# Patient Record
Sex: Female | Born: 1950 | Race: White | Hispanic: No | Marital: Married | Smoking: Former smoker
Health system: Southern US, Community
[De-identification: ages and names within clinical notes are randomized; demographics above are authoritative.]

## PROBLEM LIST (undated history)

## (undated) DIAGNOSIS — I10 Essential (primary) hypertension: Secondary | ICD-10-CM

## (undated) DIAGNOSIS — I4891 Unspecified atrial fibrillation: Secondary | ICD-10-CM

## (undated) DIAGNOSIS — L4050 Arthropathic psoriasis, unspecified: Secondary | ICD-10-CM

## (undated) DIAGNOSIS — E119 Type 2 diabetes mellitus without complications: Secondary | ICD-10-CM

## (undated) DIAGNOSIS — E0500 Thyrotoxicosis with diffuse goiter without thyrotoxic crisis or storm: Secondary | ICD-10-CM

## (undated) DIAGNOSIS — G8929 Other chronic pain: Secondary | ICD-10-CM

## (undated) DIAGNOSIS — E559 Vitamin D deficiency, unspecified: Secondary | ICD-10-CM

## (undated) HISTORY — DX: Type 2 diabetes mellitus without complications: E11.9

## (undated) HISTORY — DX: Vitamin D deficiency, unspecified: E55.9

## (undated) HISTORY — DX: Unspecified atrial fibrillation: I48.91

## (undated) HISTORY — DX: Other chronic pain: G89.29

## (undated) HISTORY — PX: VARICOSE VEIN SURGERY: SHX832

## (undated) HISTORY — DX: Arthropathic psoriasis, unspecified: L40.50

## (undated) HISTORY — DX: Essential (primary) hypertension: I10

## (undated) HISTORY — PX: CHOLECYSTECTOMY: SHX55

## (undated) HISTORY — PX: ABDOMINAL HYSTERECTOMY: SHX81

## (undated) HISTORY — PX: TOTAL KNEE ARTHROPLASTY: SHX125

## (undated) HISTORY — DX: Thyrotoxicosis with diffuse goiter without thyrotoxic crisis or storm: E05.00

---

## 2014-08-03 ENCOUNTER — Ambulatory Visit (INDEPENDENT_AMBULATORY_CARE_PROVIDER_SITE_OTHER): Payer: 59 | Admitting: Cardiology

## 2014-08-03 ENCOUNTER — Encounter: Payer: Self-pay | Admitting: Cardiology

## 2014-08-03 VITALS — BP 119/75 | HR 66 | Ht 67.0 in | Wt 222.0 lb

## 2014-08-03 DIAGNOSIS — R9439 Abnormal result of other cardiovascular function study: Secondary | ICD-10-CM

## 2014-08-03 DIAGNOSIS — R0602 Shortness of breath: Secondary | ICD-10-CM

## 2014-08-03 DIAGNOSIS — R0789 Other chest pain: Secondary | ICD-10-CM

## 2014-08-03 MED ORDER — ASPIRIN EC 81 MG PO TBEC: 81.0000 mg | DELAYED_RELEASE_TABLET | Freq: Every day | ORAL | Status: DC

## 2014-08-03 NOTE — Progress Notes (Signed)
Clinical Summary [name] is a 63 y.o.female seen today as a new patient  1. Abnormal stress test/Chest pain - ordered by pcp in setting of shortness of breath - [place]iscan [place] LVEF 74% with normal wall motion, "low grade very small perfusion defect in mid and distal anterior wall with partial reversibility", thought possible artifact.   - SOB for several years. Often worst in humid weather. Highest level of activity [facility] [employer] [facility] [employer] [employer] [facility], denies any SOB. Chronic LE edema, has had prior vein surgeries and joint surgeries. No orthopnea - + chest pain starting several years ago. Pressure in midchest, 8/10. Can happen at rest. No other associated symptoms. Pressure lasts approx 5 minutes. Better with alka seltzer. No relation to eating. No change in frequency, approx every 6 months. No change in severity. Not positional.    Past Medical History HTN DM COPD Psoriatic arthritis  Not on File   Current Outpatient Prescriptions  Medication Sig Dispense Refill  . acetaminophen (TYLENOL) 500 MG tablet Take 500 mg by mouth 2 (two) times daily as needed.      Marland Kitchen. albuterol (PROVENTIL) (2.5 MG/3ML) 0.083% nebulizer solution Take 2.5 mg by nebulization every 6 (six) hours as needed for wheezing or shortness of breath.      . bisacodyl (BISACODYL) 5 MG EC tablet Take 5 mg by mouth 2 (two) times daily as needed for moderate constipation.      . celecoxib (CELEBREX) 200 MG capsule Take 200 mg by mouth 2 (two) times daily with a meal.      . clidinium-chlordiazePOXIDE (LIBRAX) 5-2.5 MG per capsule Take 1 capsule by mouth 3 (three) times daily before meals.      . clonazePAM (KLONOPIN) 2 MG tablet Take 2 mg by mouth 2 (two) times daily.      . cyanocobalamin (,VITAMIN B-12,) 1000 MCG/ML injection Inject 1,000 mcg into the muscle once. 3 injections first week, 1 injection weekly for 4 weeks, then monthly      . Diclofenac Sodium (PENNSAID) 1.5 % SOLN Place 1.5  each onto the skin as needed.      Marland Kitchen. esomeprazole (NEXIUM) 40 MG capsule Take 40 mg by mouth daily at 12 noon.      . Fluticasone-Salmeterol (ADVAIR) 250-50 MCG/DOSE AEPB Inhale 1 puff into the lungs as needed.      . folic acid (FOLVITE) 1 MG tablet Take 1 mg by mouth 2 (two) times daily.      Marland Kitchen. glucose blood test strip 1 each by Other route as needed for other. Use as instructed      . Lancets 28G MISC 28 g by Does not apply route daily.      . metformin (FORTAMET) 500 MG (OSM) 24 hr tablet Take 500 mg by mouth 2 (two) times daily. Take 2 tablets twice a day      . Methotrexate, PF, 30 MG/0.6ML SOAJ Inject into the skin.      . metoprolol succinate (TOPROL-XL) 50 MG 24 hr tablet Take 50 mg by mouth 2 (two) times daily. Take with or immediately following a meal.      . oxyCODONE (ROXICODONE) 15 MG immediate release tablet Take 15 mg by mouth as needed for pain. Take 1 1/2 tablet four times a day      . Pediatric Multivit-Minerals-C (FLINTSTONES GUMMIES) chewable tablet Chew 1 tablet by mouth 2 (two) times daily.      . potassium chloride (KLOR-CON)  20 MEQ packet Take 20 mEq by mouth 2 (two) times daily.      . promethazine (PHENERGAN) 25 MG tablet Take 25 mg by mouth as needed for nausea or vomiting. Take 1 tablet four times a day as needed      . triamcinolone (NASACORT AQ) 55 MCG/ACT AERO nasal inhaler Place 1 spray into the nose daily.      . Tuberculin-Allergy Syringes (B-D TB SYRINGE 1CC/22GX1") 22G X 1" 1 ML MISC by Does not apply route. Use as directed      . Ustekinumab (STELARA) 90 MG/ML SOSY Inject into the skin as needed. As needed for 3 months      . Vitamin D, Ergocalciferol, (DRISDOL) 50000 UNITS CAPS capsule Take 50,000 Units by mouth. One capsule three times a week      . zolpidem (AMBIEN) 10 MG tablet Take 10 mg by mouth at bedtime as needed for sleep.      Marland Kitchen aspirin EC 81 MG tablet Take 1 tablet (81 mg total) by mouth daily.       No current facility-administered medications  for this visit.     No past surgical history on file.   Not on File    No family history on file.   Social History Veronica Harrison reports that she has quit smoking. Her smoking use included Cigarettes. She started smoking about 10 years ago. She smoked 0.00 packs per day. She has never used smokeless tobacco. Veronica Harrison has no alcohol history on file.   Review of Systems CONSTITUTIONAL: No weight loss, fever, chills, weakness or fatigue.  HEENT: Eyes: No visual loss, blurred vision, double vision or yellow sclerae.No hearing loss, sneezing, congestion, runny nose or sore throat.  SKIN: No rash or itching.  CARDIOVASCULAR: per HPI RESPIRATORY: per HPI  GASTROINTESTINAL: No anorexia, nausea, vomiting or diarrhea. No abdominal pain or blood.  GENITOURINARY: No burning on urination, no polyuria NEUROLOGICAL: No headache, dizziness, syncope, paralysis, ataxia, numbness or tingling in the extremities. No change in bowel or bladder control.  MUSCULOSKELETAL: No muscle, back pain, joint pain or stiffness.  LYMPHATICS: No enlarged nodes. No history of splenectomy.  PSYCHIATRIC: No history of depression or anxiety.  ENDOCRINOLOGIC: No reports of sweating, cold or heat intolerance. No polyuria or polydipsia.  Marland Kitchen   Physical Examination Filed Vitals:   08/03/14 1412  BP: 119/75  Pulse: 66   Filed Weights   08/03/14 1412  Weight: 222 lb (100.699 kg)    Gen: resting comfortably, no acute distress HEENT: no scleral icterus, pupils equal round and reactive, no palptable cervical adenopathy,  CV: RRR, no m/rg, no JVD Resp: Clear to auscultation bilaterally GI: abdomen is soft, non-tender, non-distended, normal bowel sounds, no hepatosplenomegaly MSK: extremities are warm, trace bilateral edema Skin: warm, no rash Neuro:  no focal deficits Psych: appropriate affect    Assessment and Plan   1. Chest pain - low risk MPI, very small defect report indicates likely artifact -  symptoms are not typical of cardiac chest pain -  Continue CAD risk factor modification - given her SOB and LE edema, will obtain echo   F/u 6 months  Antoine Poche, M.D., F.A.C.C.

## 2014-08-03 NOTE — Patient Instructions (Addendum)
   Begin Aspirin 81mg  daily  Continue all other medications.   Your physician has requested that you have an echocardiogram. Echocardiography is a painless test that uses sound waves to create images of your heart. It provides your doctor with information about the size and shape of your heart and how well your heart's chambers and valves are working. This procedure takes approximately one hour. There are no restrictions for this procedure. Office will contact with results via phone or letter.   Your physician wants you to follow up in: 6 months.  You will receive a reminder letter in the mail one-two months in advance.  If you don't receive a letter, please call our office to schedule the follow up appointment

## 2014-08-14 ENCOUNTER — Other Ambulatory Visit (INDEPENDENT_AMBULATORY_CARE_PROVIDER_SITE_OTHER): Payer: 59

## 2014-08-14 ENCOUNTER — Other Ambulatory Visit: Payer: Self-pay

## 2014-08-14 DIAGNOSIS — R0602 Shortness of breath: Secondary | ICD-10-CM

## 2014-08-16 ENCOUNTER — Telehealth: Payer: Self-pay | Admitting: *Deleted

## 2014-08-16 NOTE — Telephone Encounter (Signed)
Message copied by Jerrye BeaversJONES, Sheneka Schrom on Wed Aug 16, 2014  3:03 PM ------      Message from: IrwintonBRANCH, JONATHAN F      Created: Wed Aug 16, 2014  2:07 PM       Overall her echo looks good. Her heart muscle is stiffer than normal, this often happens as we age and also if there is a history of high blood pressure. This can cause some fluid retention/swelling and SOB. The treatment is blood pressure control and using a fluid pill as needed. Would give Rx for lasix 20mg  PO qday PRN swelling or SOB.                  Dominga FerryJ Branch MD ------

## 2014-08-17 ENCOUNTER — Encounter: Payer: Self-pay | Admitting: Cardiology

## 2014-08-17 ENCOUNTER — Other Ambulatory Visit: Payer: Self-pay | Admitting: *Deleted

## 2014-08-17 MED ORDER — FUROSEMIDE 20 MG PO TABS: ORAL_TABLET | ORAL | Status: DC

## 2014-08-17 NOTE — Telephone Encounter (Signed)
Echo results given to patient. Pt understands Lasix sent to pharmacy.

## 2014-08-22 ENCOUNTER — Telehealth: Payer: Self-pay | Admitting: *Deleted

## 2014-08-22 NOTE — Telephone Encounter (Signed)
Pt called stating that she did not receive her Lasix that was escribed on 8/20 to [facility] pharmacy.  I called pharmacy and spoke with Caryn Bee. Rx was not received and called in on 8/25.  Pt aware.

## 2014-10-11 DIAGNOSIS — R32 Unspecified urinary incontinence: Secondary | ICD-10-CM | POA: Insufficient documentation

## 2015-02-28 ENCOUNTER — Encounter: Payer: Self-pay | Admitting: Cardiology

## 2015-02-28 ENCOUNTER — Ambulatory Visit (INDEPENDENT_AMBULATORY_CARE_PROVIDER_SITE_OTHER): Payer: Medicare Other | Admitting: Cardiology

## 2015-02-28 VITALS — BP 149/80 | HR 78 | Ht 67.0 in | Wt 221.0 lb

## 2015-02-28 DIAGNOSIS — R002 Palpitations: Secondary | ICD-10-CM

## 2015-02-28 DIAGNOSIS — R9439 Abnormal result of other cardiovascular function study: Secondary | ICD-10-CM

## 2015-02-28 DIAGNOSIS — R0789 Other chest pain: Secondary | ICD-10-CM

## 2015-02-28 NOTE — Patient Instructions (Signed)

## 2015-02-28 NOTE — Progress Notes (Signed)
Clinical Summary Veronica Harrison is a 64 y.o.female seen today for follow up of the following medical problems.   1. Abnormal stress test/Chest pain - ordered by pcp in setting of shortness of breath - Lexiscan MPI 06/12/14 in [place], [place] showed LVEF 74% with normal wall motion, "low grade very small perfusion defect in mid and distal anterior wall with partial reversibility", thought possible artifact.  - echo 07/2014 LVEF 60-65%, grade II diastolic dysfunction - since last visit has had some occasional chest pain that is better with alka seltzer. Chronic SOB typically improved with inhaler.    2. Palpitations - 3 episodes over the last 1 month. Longest lasted approx 6 hours, checked pulse and was up to 132. No specific triggers. Other episodes lasted just a few seconds. Last episode was last week - coffee x 2 cups, diet coke can x1, iced tea glass nightly, no energy drinks, no EtoH.    3. HTN - checks at home, typically 140s/60s - compliant with meds         Past Medical History HTN DM COPD Psoriatic arthritis No past medical history on file.   Not on File   Current Outpatient Prescriptions  Medication Sig Dispense Refill  . acetaminophen (TYLENOL) 500 MG tablet Take 500 mg by mouth 2 (two) times daily as needed.    Marland Kitchen albuterol (PROVENTIL) (2.5 MG/3ML) 0.083% nebulizer solution Take 2.5 mg by nebulization every 6 (six) hours as needed for wheezing or shortness of breath.    Marland Kitchen aspirin EC 81 MG tablet Take 1 tablet (81 mg total) by mouth daily.    . bisacodyl (BISACODYL) 5 MG EC tablet Take 5 mg by mouth 2 (two) times daily as needed for moderate constipation.    . celecoxib (CELEBREX) 200 MG capsule Take 200 mg by mouth 2 (two) times daily with a meal.    . clidinium-chlordiazePOXIDE (LIBRAX) 5-2.5 MG per capsule Take 1 capsule by mouth 3 (three) times daily before meals.    . clonazePAM (KLONOPIN) 2 MG tablet Take 2 mg by mouth 2 (two) times daily.    .  cyanocobalamin (,VITAMIN B-12,) 1000 MCG/ML injection Inject 1,000 mcg into the muscle once. 3 injections first week, 1 injection weekly for 4 weeks, then monthly    . Diclofenac Sodium (PENNSAID) 1.5 % SOLN Place 1.5 each onto the skin as needed.    Marland Kitchen esomeprazole (NEXIUM) 40 MG capsule Take 40 mg by mouth daily at 12 noon.    . Fluticasone-Salmeterol (ADVAIR) 250-50 MCG/DOSE AEPB Inhale 1 puff into the lungs as needed.    . folic acid (FOLVITE) 1 MG tablet Take 1 mg by mouth 2 (two) times daily.    . furosemide (LASIX) 20 MG tablet Take one tablet once a day as needed for swelling or shortness of breath. 30 tablet 6  . glucose blood test strip 1 each by Other route as needed for other. Use as instructed    . Lancets 28G MISC 28 g by Does not apply route daily.    . metformin (FORTAMET) 500 MG (OSM) 24 hr tablet Take 500 mg by mouth 2 (two) times daily. Take 2 tablets twice a day    . Methotrexate, PF, 30 MG/0.6ML SOAJ Inject into the skin.    . metoprolol succinate (TOPROL-XL) 50 MG 24 hr tablet Take 50 mg by mouth 2 (two) times daily. Take with or immediately following a meal.    . oxyCODONE (ROXICODONE) 15 MG immediate release tablet  Take 15 mg by mouth as needed for pain. Take 1 1/2 tablet four times a day    . Pediatric Multivit-Minerals-C (FLINTSTONES GUMMIES) chewable tablet Chew 1 tablet by mouth 2 (two) times daily.    . potassium chloride (KLOR-CON) 20 MEQ packet Take 20 mEq by mouth 2 (two) times daily.    . promethazine (PHENERGAN) 25 MG tablet Take 25 mg by mouth as needed for nausea or vomiting. Take 1 tablet four times a day as needed    . triamcinolone (NASACORT AQ) 55 MCG/ACT AERO nasal inhaler Place 1 spray into the nose daily.    . Tuberculin-Allergy Syringes (B-D TB SYRINGE 1CC/22GX1") 22G X 1" 1 ML MISC by Does not apply route. Use as directed    . Ustekinumab (STELARA) 90 MG/ML SOSY Inject into the skin as needed. As needed for 3 months    . Vitamin D, Ergocalciferol,  (DRISDOL) 50000 UNITS CAPS capsule Take 50,000 Units by mouth. One capsule three times a week    . zolpidem (AMBIEN) 10 MG tablet Take 10 mg by mouth at bedtime as needed for sleep.     No current facility-administered medications for this visit.     No past surgical history on file.   Not on File    No family history on file.   Social History Veronica Harrison reports that she has quit smoking. Her smoking use included Cigarettes. She started smoking about 10 years ago. She has never used smokeless tobacco. Veronica Harrison has no alcohol history on file.   Review of Systems CONSTITUTIONAL: No weight loss, fever, chills, weakness or fatigue.  HEENT: Eyes: No visual loss, blurred vision, double vision or yellow sclerae.No hearing loss, sneezing, congestion, runny nose or sore throat.  SKIN: No rash or itching.  CARDIOVASCULAR: per HPI RESPIRATORY: no cough or sputum.  GASTROINTESTINAL: No anorexia, nausea, vomiting or diarrhea. No abdominal pain or blood.  GENITOURINARY: No burning on urination, no polyuria NEUROLOGICAL: No headache, dizziness, syncope, paralysis, ataxia, numbness or tingling in the extremities. No change in bowel or bladder control.  MUSCULOSKELETAL: No muscle, back pain, joint pain or stiffness.  LYMPHATICS: No enlarged nodes. No history of splenectomy.  PSYCHIATRIC: No history of depression or anxiety.  ENDOCRINOLOGIC: No reports of sweating, cold or heat intolerance. No polyuria or polydipsia.  Marland Kitchen   Physical Examination p 78 bp 149/80 Wt 221 lbs BMI 35 Gen: resting comfortably, no acute distress HEENT: no scleral icterus, pupils equal round and reactive, no palptable cervical adenopathy,  CV: RRR, no m/r/g, no JVD, no carotid bruits Resp: Clear to auscultation bilaterally GI: abdomen is soft, non-tender, non-distended, normal bowel sounds, no hepatosplenomegaly MSK: extremities are warm, no edema.  Skin: warm, no rash Neuro:  no focal deficits Psych:  appropriate affect   Diagnostic Studies  07/2014 Echo Study Conclusions  - Left ventricle: The cavity size was normal. Systolic function was normal. The estimated ejection fraction was in the range of 60% to 65%. Wall motion was normal; there were no regional wall motion abnormalities. Features are consistent with a pseudonormal left ventricular filling pattern, with concomitant abnormal relaxation and increased filling pressure (grade 2 diastolic dysfunction). Moderate posterior wall and mild septal hypertrophy. - Mitral valve: There was trivial regurgitation. - Left atrium: The atrium was mildly dilated.   Assessment and Plan  1. Chest pain - low risk MPI, very small defect, report indicates likely artifact - symptoms are not typical of cardiac chest pain - Continue CAD risk factor modification  2. Palpitations - counseled to decrease caffeine intake and follow symptoms. If persist with obtain event monitor  3. HTN - at goal, continue current meds  F/u 6 months      Antoine PocheJonathan F. Madason Rauls, M.D.

## 2015-03-06 ENCOUNTER — Telehealth: Payer: Self-pay | Admitting: Cardiology

## 2015-03-06 NOTE — Telephone Encounter (Signed)
[facility] 03[facility]06[facility]15 offered sooner appointment with Dr Wyline [place]Branch [place] 3[place]9 @ 1:40 pm but patient refused after her request to be seen this week. Patient was scheduled with our first available on March 24th, but instructed to call if needed before.  She also asked for [facility] [address] office number to request to see an EP doctor.  I gave her the number but explained Dr Wyline MoodBranch typically would refer her if she needed to see an EP doctor.

## 2015-03-08 ENCOUNTER — Ambulatory Visit (INDEPENDENT_AMBULATORY_CARE_PROVIDER_SITE_OTHER): Payer: Medicare Other | Admitting: Cardiology

## 2015-03-08 ENCOUNTER — Encounter: Payer: Self-pay | Admitting: *Deleted

## 2015-03-08 ENCOUNTER — Encounter: Payer: Self-pay | Admitting: Cardiology

## 2015-03-08 VITALS — BP 153/74 | HR 84 | Ht 67.0 in | Wt 223.0 lb

## 2015-03-08 DIAGNOSIS — I480 Paroxysmal atrial fibrillation: Secondary | ICD-10-CM

## 2015-03-08 DIAGNOSIS — E119 Type 2 diabetes mellitus without complications: Secondary | ICD-10-CM

## 2015-03-08 DIAGNOSIS — I10 Essential (primary) hypertension: Secondary | ICD-10-CM

## 2015-03-08 MED ORDER — METOPROLOL SUCCINATE ER 25 MG PO TB24: 75.0000 mg | ORAL_TABLET | Freq: Every day | ORAL | Status: DC

## 2015-03-08 NOTE — Patient Instructions (Signed)
Your physician recommends that you schedule a follow-up appointment in: 3 months with DR. Branch  Your physician has recommended you make the following change in your medication:   INCREASE TOPROL TO 75 MG DAILY  START ASPIRIN 325 MG DAILY  CONTINUE ALL OTHER MEDICATIONS AS DIRECTED  Thank you for choosing [facility]!!

## 2015-03-08 NOTE — Progress Notes (Addendum)
Clinical Summary Veronica Harrison is a 64 y.o.female seen today for follow up on the following medical problems. This is a focused visit on a recent diagnosis of afib.    1. Afib  - [facility] with [facility] reaction to amoxicillin. Found to be in afib with RVR rates in 140-150s, new diagnosis for patient. Started on dilt gtt and converted to NSR. TSH at time was low at 0.13, high free T4 at 2.09. Mg 1.5 - she has had intermittent palpitations on and off for the last several months - CHADS2Vasc score is 3( gender, HTN, DM), decision to start anticoag was deferred during that admission until our follow up.     PMH 1. HTN 2. DM2 3. Hyperthyroidism   No Known Allergies   Current Outpatient Prescriptions  Medication Sig Dispense Refill  . acetaminophen (TYLENOL) 500 MG tablet Take 500 mg by mouth 2 (two) times daily as needed.    Marland Kitchen. albuterol (PROVENTIL) (2.5 MG/3ML) 0.083% nebulizer solution Take 2.5 mg by nebulization every 6 (six) hours as needed for wheezing or shortness of breath.    Marland Kitchen. aspirin EC 81 MG tablet Take 1 tablet (81 mg total) by mouth daily.    . bisacodyl (BISACODYL) 5 MG EC tablet Take 5 mg by mouth 2 (two) times daily as needed for moderate constipation.    . celecoxib (CELEBREX) 200 MG capsule Take 200 mg by mouth 2 (two) times daily with a meal.    . clidinium-chlordiazePOXIDE (LIBRAX) 5-2.5 MG per capsule Take 1 capsule by mouth 3 (three) times daily before meals.    . clonazePAM (KLONOPIN) 2 MG tablet Take 2 mg by mouth 2 (two) times daily.    . cyanocobalamin (,VITAMIN B-12,) 1000 MCG/ML injection Inject 1,000 mcg into the muscle once. 3 injections first week, 1 injection weekly for 4 weeks, then monthly    . Diclofenac Sodium (PENNSAID) 1.5 % SOLN Place 1.5 each onto the skin as needed.    Marland Kitchen. esomeprazole (NEXIUM) 40 MG capsule Take 40 mg by mouth daily at 12 noon.    .  Fluticasone-Salmeterol (ADVAIR) 250-50 MCG/DOSE AEPB Inhale 1 puff into the lungs as needed.    . folic acid (FOLVITE) 1 MG tablet Take 1 mg by mouth 2 (two) times daily.    . furosemide (LASIX) 20 MG tablet Take one tablet once a day as needed for swelling or shortness of breath. 30 tablet 6  . glucose blood test strip 1 each by Other route as needed for other. Use as instructed    . Lancets 28G MISC 28 g by Does not apply route daily.    . metFORMIN (GLUCOPHAGE) 1000 MG tablet Take 1,000 mg by mouth 2 (two) times daily with a meal.    . Methotrexate, PF, 30 MG/0.6ML SOAJ Inject 17 mg into the skin once a week.    . metoprolol succinate (TOPROL-XL) 50 MG 24 hr tablet Take 50 mg by mouth 2 (two) times daily. Take with or immediately following a meal.    . oxyCODONE (ROXICODONE) 15 MG immediate release tablet Take 15 mg by mouth as needed for pain. Take 1 1/2 tablet four times a day    . Pediatric Multivit-Minerals-C (FLINTSTONES GUMMIES) chewable tablet Chew 1 tablet by mouth 2 (two) times daily.    . potassium chloride (KLOR-CON) 20 MEQ packet Take 20 mEq by mouth 2 (two) times daily.    .Marland Kitchen  promethazine (PHENERGAN) 25 MG tablet Take 25 mg by mouth as needed for nausea or vomiting. Take 1 tablet four times a day as needed    . triamcinolone (NASACORT AQ) 55 MCG/ACT AERO nasal inhaler Place 1 spray into the nose daily.    . Tuberculin-Allergy Syringes (B-D TB SYRINGE 1CC/22GX1") 22G X 1" 1 ML MISC by Does not apply route. Use as directed    . Ustekinumab (STELARA) 90 MG/ML SOSY Inject into the skin as needed. As needed for 3 months    . Vitamin D, Ergocalciferol, (DRISDOL) 50000 UNITS CAPS capsule Take 50,000 Units by mouth. One capsule three times a week     No current facility-administered medications for this visit.     No past surgical history on file.   No Known Allergies    No family history on file.   Social History Ms. Bray reports that she quit smoking about 10 years ago.  Her smoking use included Cigarettes. She started smoking about 45 years ago. She has a 34 pack-year smoking history. She has never used smokeless tobacco. Ms. George has no alcohol history on file.   Review of Systems CONSTITUTIONAL: No weight loss, fever, chills, weakness or fatigue.  HEENT: Eyes: No visual loss, blurred vision, double vision or yellow sclerae.No hearing loss, sneezing, congestion, runny nose or sore throat.  SKIN: No rash or itching.  CARDIOVASCULAR: per HPI RESPIRATORY: No shortness of breath, cough or sputum.  GASTROINTESTINAL: No anorexia, nausea, vomiting or diarrhea. No abdominal pain or blood.  GENITOURINARY: No burning on urination, no polyuria NEUROLOGICAL: No headache, dizziness, syncope, paralysis, ataxia, numbness or tingling in the extremities. No change in bowel or bladder control.  MUSCULOSKELETAL: No muscle, back pain, joint pain or stiffness.  LYMPHATICS: No enlarged nodes. No history of splenectomy.  PSYCHIATRIC: No history of depression or anxiety.  ENDOCRINOLOGIC: No reports of sweating, cold or heat intolerance. No polyuria or polydipsia.  Marland Kitchen   Physical Examination p 84 bp 153/74 Wt 223 lbs BMI 35 Gen: resting comfortably, no acute distress HEENT: no scleral icterus, pupils equal round and reactive, no palptable cervical adenopathy,  CV: RRR, no m/r/g, no JVD Resp: Clear to auscultation bilaterally GI: abdomen is soft, non-tender, non-distended, normal bowel sounds, no hepatosplenomegaly MSK: extremities are warm, no edema.  Skin: warm, no rash Neuro:  no focal deficits Psych: appropriate affect   Diagnostic Studies 07/2014 Echo Study Conclusions  - Left ventricle: The cavity size was normal. Systolic function was normal. The estimated ejection fraction was in the range of 60% to 65%. Wall motion was normal; there were no regional wall motion abnormalities. Features are consistent with a pseudonormal left ventricular filling  pattern, with concomitant abnormal relaxation and increased filling pressure (grade 2 diastolic dysfunction). Moderate posterior wall and mild septal hypertrophy. - Mitral valve: There was trivial regurgitation. - Left atrium: The atrium was mildly dilated.     Assessment and Plan  1. Afib - new diagnosis during recent admission - occurred in setting of allergic reaction. Mg was low, and also she has been newly diagnosed with hyperthyroidism - will increase her Toprol XL to  bid - discussed in detail stroke risk given CHADS2Vasc score of 3, and that optimal prevention would be with anticoag. She is hesitant to start at this time and requests to remain on ASA despite the increased risk. Continue ASA, readdress next visit  2. Hyperthyroidism - I have asked her to discuss with her pcp possible referral to endocrine  F/u  3 months      Antoine Poche, M.D.   Addendum 03/15/15 Received notice that patient is going to have colonscopy. May hold ASA as needed for procedure. From cardiac standpoint there is no contraindication.  Dominga Ferry MD

## 2015-03-09 ENCOUNTER — Telehealth: Payer: Self-pay | Admitting: *Deleted

## 2015-03-09 MED ORDER — METOPROLOL SUCCINATE ER 25 MG PO TB24: 75.0000 mg | ORAL_TABLET | Freq: Two times a day (BID) | ORAL | Status: DC

## 2015-03-09 NOTE — Telephone Encounter (Signed)
Patient informed. 

## 2015-03-09 NOTE — Telephone Encounter (Signed)
Patient called to clarify dose of toprol xl. Patient was instructed to increase her toprol xl to 100 mg twice daily but instructions and prescription sent to pharmacy says's increase toprol xl to 75 mg daily. Please advise on correct directions and dose for toprol xl.

## 2015-03-09 NOTE — Telephone Encounter (Signed)
Veronica Harrison was on Toprol XL 50mg  bid, she should increase to Toprol XL 75mg  bid.

## 2015-03-10 DIAGNOSIS — I10 Essential (primary) hypertension: Secondary | ICD-10-CM | POA: Insufficient documentation

## 2015-03-10 DIAGNOSIS — E119 Type 2 diabetes mellitus without complications: Secondary | ICD-10-CM | POA: Insufficient documentation

## 2015-03-10 DIAGNOSIS — I4891 Unspecified atrial fibrillation: Secondary | ICD-10-CM | POA: Insufficient documentation

## 2015-03-13 ENCOUNTER — Encounter: Payer: Self-pay | Admitting: Cardiology

## 2015-03-14 ENCOUNTER — Telehealth: Payer: Self-pay | Admitting: *Deleted

## 2015-03-14 NOTE — Telephone Encounter (Signed)
Pt having colonoscopy and EGD on 3/29 and Dr. Merlinda Frederick'neil's office wanting a letter of clearance from cardiologist. Pt is having root canal  Possible extraction on April 4th and wanting to know if epinephrine will cause arrhthymias. Pt also wanting Dr. Wyline MoodBranch to know that she will want to start anticoagulation after these procedures. Will forward to Dr. Wyline MoodBranch

## 2015-03-14 NOTE — Telephone Encounter (Signed)
Heart rate of 88 is within normal range, this would not cause fatigue. The recent increase in her Toprol would more likely be the cause of fatigue as well as her ongoing thyroid issues. If related to Toprol typically this gets better as your body gets used to the higher dose. Regarding the thyroid that is the responsibility of her primary care doctor. I had offered to place a referral if she was unable to get in touch with her pcp, but now that referral is in  in there is nothing we can do to expedite her appointment.   Dominga FerryJ Zamorah Ailes MD

## 2015-03-14 NOTE — Telephone Encounter (Signed)
Pt made aware. Was told by us and primary Dr. Claiborne Billingshat normal HR is 60-100. Explained that since we didn't make referral that we couldn't expedite appt. Pt understood and thanked us for returning call.

## 2015-03-14 NOTE — Telephone Encounter (Signed)
Pt c/o HR 88 and being tired and easily SOB. Pt has been taking metoprolol as directed at LOV. Pt states she has not felt any "skipped beats" but HR is consistently at 88 and pt says this HR is making her tired. Pt has been referred to Dr. Juleen ChinaKohut for Thyroid. Pt would like us to call that office to speed up getting an appt. Pt was referred by Dr. Burna MortimerIsernia. Pt says has called that office numerous times. Will call Kohut's office and forward to Dr. Wyline MoodBranch for further suggestions.

## 2015-03-15 NOTE — Telephone Encounter (Signed)
Pt made aware. Forwarded note to Dr. Carmon Ginsberg'neil. Pt will call after procedures are complete to start anticoagulation.

## 2015-03-15 NOTE — Telephone Encounter (Signed)
Please send my addendum note to her GI doctor. She is ok for both her GI and dental procedure, if needed ok to hold aspirin. There is no contraindication to using epinephrine for bleeding control during dental procedure. Please have her call us back one all procedures are completed and we will get her started on anticoagulation.  Dominga FerryJ Arabela Basaldua MD

## 2015-03-22 ENCOUNTER — Encounter: Payer: Medicare Other | Admitting: Cardiology

## 2015-03-27 ENCOUNTER — Telehealth: Payer: Self-pay | Admitting: Cardiology

## 2015-03-27 NOTE — Telephone Encounter (Signed)
Dr. O'Neill office needs more information from our office for upcoming colonoscopy that has been postponed due to lack of informBufford Buttneration. Please call Dr. Bufford Buttner'Neill [phone] office and ask for [facility] . Mrs. Lajean SilviusBateman would like a call back.

## 2015-04-03 ENCOUNTER — Telehealth: Payer: Self-pay | Admitting: *Deleted

## 2015-04-03 NOTE — Telephone Encounter (Signed)
Claudia from Dr. Colin Ina'Neill's office is off today and will return tomorrow. Called pt and explained that we had sent Dr. Verna CzechBranch's note on the 17th of March giving clearance from a cardiac standpoint for colonoscopy with ok to hold ASA as directed. Pt states that she should have enough info to schedule. Will call us if any other documentation is needed.

## 2015-04-03 NOTE — Telephone Encounter (Signed)
Pt says its time for stelera injection 90 mg once every 3 months if this is still ok, pt is concerned it might effect heart. Will forward to Dr. Wyline MoodBranch

## 2015-04-04 NOTE — Telephone Encounter (Signed)
Stelera injections are ok from a heart standpoint  Dominga FerryJ Kameron Glazebrook MD

## 2015-04-04 NOTE — Telephone Encounter (Signed)
Pt made aware

## 2015-06-04 ENCOUNTER — Other Ambulatory Visit: Payer: Self-pay | Admitting: Cardiology

## 2015-06-19 ENCOUNTER — Encounter: Payer: Self-pay | Admitting: *Deleted

## 2015-06-20 ENCOUNTER — Encounter: Payer: Self-pay | Admitting: Cardiology

## 2015-06-20 ENCOUNTER — Ambulatory Visit (INDEPENDENT_AMBULATORY_CARE_PROVIDER_SITE_OTHER): Payer: Medicare Other | Admitting: Cardiology

## 2015-06-20 VITALS — BP 139/75 | HR 72 | Ht 67.0 in | Wt 222.0 lb

## 2015-06-20 DIAGNOSIS — I4891 Unspecified atrial fibrillation: Secondary | ICD-10-CM

## 2015-06-20 MED ORDER — FUROSEMIDE 20 MG PO TABS: ORAL_TABLET | ORAL | Status: DC

## 2015-06-20 NOTE — Progress Notes (Signed)
Clinical Summary Veronica Harrison is a 64 y.o.female seen today for follow up of the following medical problems.   1. Afib  - fairly new diagnosis during recent admit to [facility] - admit to [facility] with allergic reaction to amoxicillin. Found to be in afib with RVR rates in 140-150s, new diagnosis for patient. Started on dilt gtt and converted to NSR. TSH at time was low at 0.13, high free T4 at 2.09. Mg 1.5 - she has had intermittent palpitations on and off for the last several months - CHADS2Vasc score is 3( gender, HTN, DM), she did not want to start anticoagulation at last visit.  - since last visit reports fairly infrequent episodes of palpitations.    2. Hyperthyroidism - she reports diagnosed with Graves disease, followed by endocrine   Past Medical History  Diagnosis Date  . Type 2 diabetes mellitus   . Hypertension   . Psoriatic arthritis   . Chronic pain   . Atrial fibrillation   . Vitamin D deficiency      No Known Allergies   Current Outpatient Prescriptions  Medication Sig Dispense Refill  . acetaminophen (TYLENOL) 500 MG tablet Take 500 mg by mouth 2 (two) times daily as needed.    Marland Kitchen albuterol (PROVENTIL) (2.5 MG/3ML) 0.083% nebulizer solution Take 2.5 mg by nebulization every 6 (six) hours as needed for wheezing or shortness of breath.    Marland Kitchen aspirin 325 MG tablet Take 325 mg by mouth daily.    . bisacodyl (BISACODYL) 5 MG EC tablet Take 5 mg by mouth 2 (two) times daily as needed for moderate constipation.    . celecoxib (CELEBREX) 200 MG capsule Take 200 mg by mouth 2 (two) times daily with a meal.    . clidinium-chlordiazePOXIDE (LIBRAX) 5-2.5 MG per capsule Take 1 capsule by mouth 3 (three) times daily before meals.    . clindamycin (CLEOCIN) 150 MG capsule Take by mouth 4 (four) times daily.    . clonazePAM (KLONOPIN) 2 MG tablet Take 1 mg by mouth at bedtime.     . cyanocobalamin (,VITAMIN B-12,) 1000 MCG/ML injection Inject 1,000 mcg  into the muscle once. 3 injections first week, 1 injection weekly for 4 weeks, then monthly    . Diclofenac Sodium (PENNSAID) 1.5 % SOLN Place 1.5 each onto the skin as needed.    Marland Kitchen esomeprazole (NEXIUM) 40 MG capsule Take 40 mg by mouth daily at 12 noon.    . Fluticasone-Salmeterol (ADVAIR) 250-50 MCG/DOSE AEPB Inhale 1 puff into the lungs as needed.    . folic acid (FOLVITE) 1 MG tablet Take 1 mg by mouth 2 (two) times daily.    . furosemide (LASIX) 20 MG tablet Take one tablet once a day as needed for swelling or shortness of breath. 30 tablet 6  . glucose blood test strip 1 each by Other route as needed for other. Use as instructed    . Iron-Vitamin C (VITRON-C PO) Take 1 tablet by mouth daily.    . Lancets 28G MISC 28 g by Does not apply route daily.    . magnesium oxide (MAG-OX) 400 MG tablet Take 400 mg by mouth 2 (two) times daily.    . metFORMIN (GLUCOPHAGE) 1000 MG tablet Take 1,000 mg by mouth 2 (two) times daily with a meal.    . Methotrexate, PF, 30 MG/0.6ML SOAJ Inject 17 mg into the skin once a week.    . metoprolol succinate (TOPROL-XL) 25 MG 24 hr tablet  TAKE 3 TABLETS ( ) BY MOUTH TWICE DAILY 180 tablet 2  . oxyCODONE (ROXICODONE) 15 MG immediate release tablet Take 15 mg by mouth as needed for pain. Take 1 1/2 tablet four times a day    . potassium chloride (KLOR-CON) 20 MEQ packet Take 20 mEq by mouth 2 (two) times daily.    . promethazine (PHENERGAN) 25 MG tablet Take 25 mg by mouth as needed for nausea or vomiting. Take 1 tablet four times a day as needed    . triamcinolone (NASACORT AQ) 55 MCG/ACT AERO nasal inhaler Place 1 spray into the nose daily.    . Tuberculin-Allergy Syringes (B-D TB SYRINGE 1CC/22GX1") 22G X 1" 1 ML MISC by Does not apply route. Use as directed    . Ustekinumab (STELARA) 90 MG/ML SOSY Inject into the skin as needed. As needed for 3 months    . Vitamin D, Ergocalciferol, (DRISDOL) 50000 UNITS CAPS capsule Take 50,000 Units by mouth. One capsule  three times a week     No current facility-administered medications for this visit.     Past Surgical History  Procedure Laterality Date  . Varicose vein surgery Left   . Cholecystectomy    . Total knee arthroplasty Right   . Abdominal hysterectomy       No Known Allergies    Family History  Problem Relation Age of Onset  . Diabetes    . CAD       Social History Veronica Harrison reports that she quit smoking about 10 years ago. Her smoking use included Cigarettes. She started smoking about 45 years ago. She has a 34 pack-year smoking history. She has never used smokeless tobacco. Veronica Harrison has no alcohol history on file.   Review of Systems CONSTITUTIONAL: No weight loss, fever, chills, weakness or fatigue.  HEENT: Eyes: No visual loss, blurred vision, double vision or yellow sclerae.No hearing loss, sneezing, congestion, runny nose or sore throat.  SKIN: No rash or itching.  CARDIOVASCULAR: per HPI RESPIRATORY: No shortness of breath, cough or sputum.  GASTROINTESTINAL: No anorexia, nausea, vomiting or diarrhea. No abdominal pain or blood.  GENITOURINARY: No burning on urination, no polyuria NEUROLOGICAL: No headache, dizziness, syncope, paralysis, ataxia, numbness or tingling in the extremities. No change in bowel or bladder control.  MUSCULOSKELETAL: No muscle, back pain, joint pain or stiffness.  LYMPHATICS: No enlarged nodes. No history of splenectomy.  PSYCHIATRIC: No history of depression or anxiety.  ENDOCRINOLOGIC: No reports of sweating, cold or heat intolerance. No polyuria or polydipsia.  Marland Kitchen   Physical Examination Filed Vitals:   06/20/15 1457  BP: 139/75  Pulse: 72   Filed Vitals:   06/20/15 1457  Height:  (1.702 m)  Weight: 222 lb (100.699 kg)    Gen: resting comfortably, no acute distress HEENT: no scleral icterus, pupils equal round and reactive, no palptable cervical adenopathy,  CV: RRR, no m/rg, no jVD, no carotid bruits Resp: Clear to  auscultation bilaterally GI: abdomen is soft, non-tender, non-distended, normal bowel sounds, no hepatosplenomegaly MSK: extremities are warm, no edema.  Skin: warm, no rash Neuro:  no focal deficits Psych: appropriate affect   Diagnostic Studies  07/2014 Echo Study Conclusions  - Left ventricle: The cavity size was normal. Systolic function was normal. The estimated ejection fraction was in the range of 60% to 65%. Wall motion was normal; there were no regional wall motion abnormalities. Features are consistent with a pseudonormal left ventricular filling pattern, with concomitant abnormal relaxation and increased  filling pressure (grade 2 diastolic dysfunction). Moderate posterior wall and mild septal hypertrophy. - Mitral valve: There was trivial regurgitation. - Left atrium: The atrium was mildly dilated.   Assessment and Plan   1. Afib - new diagnosis during recent admission - occurred in setting of allergic reaction. Mg was low, and also she has been newly diagnosed with hyperthyroidism. Unclear if isolated episode in this setting or if ongoing problem - will obtain 14 day event monitor to evaluate for afib to help decide on use of long term anticoagulation. Continue ASA for now.   2. Hyperthyroidism - followed by endocrine, she reports numbers are trending down, has not required therapy as of yet.    F/u 6 months. Once monitor results available, may need earlier appointment to discuss anticoagulation.    Antoine Poche, MD

## 2015-06-20 NOTE — Patient Instructions (Signed)
Your physician wants you to follow-up in: 6 months with Dr. Lurena Joiner will receive a reminder letter in the mail two months in advance. If you don't receive a letter, please call our office to schedule the follow-up appointment.  Your physician has recommended you make the following change in your medication:   DECREASE ASPIRIN 325 MG DAILY  WE HAVE REFILLED LASIX  Your physician has recommended that you wear an event monitor FOR 2 WEEKS. Event monitors are medical devices that record the heart's electrical activity. Doctors most often [place] these monitors to diagnose arrhythmias. Arrhythmias are problems with the speed or rhythm of the heartbeat. The monitor is a small, portable device. You can wear one while you do your normal daily activities. This is usually used to diagnose what is causing palpitations/syncope (passing out).  Thank you for choosing [facility]!!

## 2015-06-22 ENCOUNTER — Telehealth: Payer: Self-pay | Admitting: *Deleted

## 2015-06-22 NOTE — Telephone Encounter (Signed)
Pt will send event monitor back and would like to try new bodyguard wireless monitor. Will call [employer] to make this arrangement. Pt agreeable to this plan. Will forward as FYI to Dr. Wyline Mood

## 2015-06-22 NOTE — Telephone Encounter (Signed)
Pt received event monitor and says it is to heavy to wear with the weather being hot and pt only wear elastic. Would like to know if she could wear 48 hr holter instead. Will forward to Dr. Wyline Mood

## 2015-06-22 NOTE — Telephone Encounter (Signed)
48 hour monitor would not be a long enough sample time. Still recommend the event monitor  Dominga Ferry MD

## 2015-06-27 ENCOUNTER — Telehealth: Payer: Self-pay | Admitting: *Deleted

## 2015-06-27 NOTE — Telephone Encounter (Signed)
Pt insurance will not cover 2 week even monitor and pt will not be able to afford. Will forward to Dr. Wyline MoodBranch for further instructions

## 2015-06-27 NOTE — Telephone Encounter (Signed)
Insurance will only cover lifewatch and cardionet.

## 2015-06-27 NOTE — Telephone Encounter (Signed)
Patient had episode of atrial fibrillation, we are looking for recurrence to consider anticoagulation for her to lower her stroke risk. Do we know why her insurance refused   Dominga FerryJ Deforest Maiden MD

## 2015-06-29 NOTE — Telephone Encounter (Signed)
Pt says she is [facility] to [facility] will [facility] event monitor, doesn't  not want [place] without checking closer location 1st. [facility] and have monitor placed if she could not get [place]. Pt will call and let us know Tuesday how she will proceed. Will Forward to Dr. Wyline MoodBranch as Lorain ChildesFYI

## 2015-07-30 ENCOUNTER — Encounter: Payer: Self-pay | Admitting: *Deleted

## 2015-08-20 ENCOUNTER — Telehealth: Payer: Self-pay | Admitting: *Deleted

## 2015-08-20 NOTE — Telephone Encounter (Signed)
Opened in error

## 2015-08-20 NOTE — Telephone Encounter (Signed)
Pt will come to appt tomorrow 8/23

## 2015-08-20 NOTE — Telephone Encounter (Signed)
-----   Message from Antoine Poche, MD sent at 08/16/2015 12:22 PM EDT ----- Regarding: RE: [name] Let her know that monitor received, it does show some episodes of afib. Has she given any more thought to anticoagulation that we talked about last visit? The fact that the afib is still happening continues to put her at higher risk for stroke.   Dominga Ferry MD ----- Message -----    From: Albertine Patricia, CMA    Sent: 08/16/2015   8:39 AM      To: Antoine Poche, MD Subject: FYI                                            [name] was scanned into media, didn't know if it was routed to you.   Veronica Harrison

## 2015-08-21 ENCOUNTER — Encounter: Payer: Self-pay | Admitting: Cardiology

## 2015-08-21 ENCOUNTER — Encounter: Payer: Self-pay | Admitting: *Deleted

## 2015-08-21 ENCOUNTER — Ambulatory Visit (INDEPENDENT_AMBULATORY_CARE_PROVIDER_SITE_OTHER): Payer: Medicare Other | Admitting: Cardiology

## 2015-08-21 VITALS — BP 149/77 | HR 88 | Ht 67.0 in | Wt 217.0 lb

## 2015-08-21 DIAGNOSIS — I480 Paroxysmal atrial fibrillation: Secondary | ICD-10-CM

## 2015-08-21 MED ORDER — METOPROLOL SUCCINATE ER 25 MG PO TB24: 100.0000 mg | ORAL_TABLET | Freq: Two times a day (BID) | ORAL | Status: DC

## 2015-08-21 MED ORDER — DILTIAZEM HCL 30 MG PO TABS: 30.0000 mg | ORAL_TABLET | Freq: Two times a day (BID) | ORAL | Status: DC

## 2015-08-21 NOTE — Patient Instructions (Signed)
Your physician recommends that you schedule a follow-up appointment in: 3 MONTHS WITH DR. BRANCH  Your physician has recommended you make the following change in your medication:   INCREASE TOPROL 4 TAB 100 MG TWICE DAILY  START DILTIAZEM 30 MG TWICE DAILY  Thank you for choosing [facility]!!

## 2015-08-21 NOTE — Progress Notes (Signed)
Patient ID: Veronica Harrison, female   DOB: 1951/06/25, 64 y.o.   MRN: [id number]     Clinical Summary Veronica Harrison is a 64 y.o.female seen today for follow up of the following medical problems.   1. Afib  - fairly new diagnosis during recent admit to [facility] - admit to [facility] with allergic reaction to amoxicillin. Found to be in afib with RVR rates in 140-150s, new diagnosis for patient. Started on dilt gtt and converted to NSR. TSH at time was low at 0.13, high free T4 at 2.09. Mg 1.5  - since last visit completed holter monitor which did show recurrent episodes of afib, some episodes with elevated rates.  - notes palpitations at times. 3-4 times a day, lasts up to 30 minutes.    2.Anemia -  Seen by Dr Flora Lipps GI in [place], has appointment tomorrow. She reports FOBT was + by home stool card.    3. Hyperthyroidism - she reports diagnosed with Graves disease, followed by endocrine Past Medical History  Diagnosis Date  . Type 2 diabetes mellitus   . Hypertension   . Psoriatic arthritis   . Chronic pain   . Atrial fibrillation   . Vitamin D deficiency   . Graves' disease      No Known Allergies   Current Outpatient Prescriptions  Medication Sig Dispense Refill  . acetaminophen (TYLENOL) 500 MG tablet Take 500 mg by mouth 2 (two) times daily as needed.    Marland Kitchen albuterol (PROVENTIL) (2.5 MG/3ML) 0.083% nebulizer solution Take 2.5 mg by nebulization every 6 (six) hours as needed for wheezing or shortness of breath.    Marland Kitchen aspirin 325 MG tablet Take 325 mg by mouth daily.     . bisacodyl (BISACODYL) 5 MG EC tablet Take 5 mg by mouth 2 (two) times daily as needed for moderate constipation.    . celecoxib (CELEBREX) 200 MG capsule Take 200 mg by mouth 2 (two) times daily with a meal.    . clidinium-chlordiazePOXIDE (LIBRAX) 5-2.5 MG per capsule Take 1 capsule by mouth daily.     . clindamycin (CLEOCIN) 150 MG capsule Take 600 mg by mouth as directed.     .  clonazePAM (KLONOPIN) 2 MG tablet Take 1 mg by mouth at bedtime.     . cyanocobalamin (,VITAMIN B-12,) 1000 MCG/ML injection Inject 1,000 mcg into the muscle every 30 (thirty) days.     Marland Kitchen esomeprazole (NEXIUM) 40 MG capsule Take 40 mg by mouth daily at 12 noon.    . Fluticasone-Salmeterol (ADVAIR) 250-50 MCG/DOSE AEPB Inhale 1 puff into the lungs as needed.    . folic acid (FOLVITE) 1 MG tablet Take 1 mg by mouth 2 (two) times daily.    . furosemide (LASIX) 20 MG tablet Take one tablet once a day as needed for swelling or shortness of breath. 30 tablet 6  . glucose blood test strip 1 each by Other route as needed for other. Use as instructed    . Iron-Vitamin C (VITRON-C PO) Take 1 tablet by mouth daily.    . Lancets 28G MISC 28 g by Does not apply route daily.    . magnesium oxide (MAG-OX) 400 MG tablet Take 400 mg by mouth 2 (two) times daily.    . metFORMIN (GLUCOPHAGE) 1000 MG tablet Take 1,000 mg by mouth 2 (two) times daily with a meal.    . Methotrexate, PF, 30 MG/0.6ML SOAJ Inject 17 mg into the skin once a week.    Marland Kitchen  metoprolol succinate (TOPROL-XL) 25 MG 24 hr tablet TAKE 3 TABLETS (75MG ) BY MOUTH TWICE DAILY 180 tablet 2  . oxyCODONE (ROXICODONE) 15 MG immediate release tablet Take 15 mg by mouth as needed for pain. Take 1 1/2 tablet four times a day    . potassium chloride (KLOR-CON) 20 MEQ packet Take 20 mEq by mouth 2 (two) times daily.    . promethazine (PHENERGAN) 25 MG tablet Take 25 mg by mouth as needed for nausea or vomiting. Take 1 tablet four times a day as needed    . triamcinolone (NASACORT AQ) 55 MCG/ACT AERO nasal inhaler Place 1 spray into the nose daily.    . Tuberculin-Allergy Syringes (B-D TB SYRINGE 1CC/22GX1") 22G X 1" 1 ML MISC by Does not apply route. Use as directed    . Ustekinumab (STELARA) 90 MG/ML SOSY Inject into the skin as needed. As needed for 3 months    . Vitamin D, Ergocalciferol, (DRISDOL) 50000 UNITS CAPS capsule Take 50,000 Units by mouth. One  capsule three times a week     No current facility-administered medications for this visit.     Past Surgical History  Procedure Laterality Date  . Varicose vein surgery Left   . Cholecystectomy    . Total knee arthroplasty Right   . Abdominal hysterectomy       No Known Allergies    Family History  Problem Relation Age of Onset  . Diabetes    . CAD       Social History Veronica Harrison reports that she quit smoking about 10 years ago. Her smoking use included Cigarettes. She started smoking about 45 years ago. She has a 34 pack-year smoking history. She has never used smokeless tobacco. Veronica Harrison has no alcohol history on file.   Review of Systems CONSTITUTIONAL: No weight loss, fever, chills, weakness or fatigue.  HEENT: Eyes: No visual loss, blurred vision, double vision or yellow sclerae.No hearing loss, sneezing, congestion, runny nose or sore throat.  SKIN: No rash or itching.  CARDIOVASCULAR: per HPI RESPIRATORY: No shortness of breath, cough or sputum.  GASTROINTESTINAL: No anorexia, nausea, vomiting or diarrhea. No abdominal pain or blood.  GENITOURINARY: No burning on urination, no polyuria NEUROLOGICAL: No headache, dizziness, syncope, paralysis, ataxia, numbness or tingling in the extremities. No change in bowel or bladder control.  MUSCULOSKELETAL: No muscle, back pain, joint pain or stiffness.  LYMPHATICS: No enlarged nodes. No history of splenectomy.  PSYCHIATRIC: No history of depression or anxiety.  ENDOCRINOLOGIC: No reports of sweating, cold or heat intolerance. No polyuria or polydipsia.  Marland Kitchen   Physical Examination Filed Vitals:   08/21/15 1136  BP: 149/77  Pulse: 88   Filed Vitals:   08/21/15 1136  Height: 5\' 7"  (1.702 m)  Weight: 217 lb (98.431 kg)    Gen: resting comfortably, no acute distress HEENT: no scleral icterus, pupils equal round and reactive, no palptable cervical adenopathy,  CV: RRR, no m/r/g, no jvd Resp: Clear to  auscultation bilaterally GI: abdomen is soft, non-tender, non-distended, normal bowel sounds, no hepatosplenomegaly MSK: extremities are warm, no edema.  Skin: warm, no rash Neuro:  no focal deficits Psych: appropriate affect   Diagnostic Studies 07/2014 Echo Study Conclusions  - Left ventricle: The cavity size was normal. Systolic function was normal. The estimated ejection fraction was in the range of 60% to 65%. Wall motion was normal; there were no regional wall motion abnormalities. Features are consistent with a pseudonormal left ventricular filling pattern, with concomitant  abnormal relaxation and increased filling pressure (grade 2 diastolic dysfunction). Moderate posterior wall and mild septal hypertrophy. - Mitral valve: There was trivial regurgitation. - Left atrium: The atrium was mildly dilated.   06/2015 Holter monitor [facility]: short episodes of afib with RVR  Assessment and Plan  1. Afib - initially thought to be isolated episode during hospital admission, however cardiac monitor shows recurrent episodes - continues to have palpitations. Will increase Toprol XL to  bid, start diltiazem  prn - CHADS2Vasc score is 3 (gender, HTN, DM2), she is willing to start anticoag but will need to wait until her anemia workup is complete. She is to call after she follows up with GI.    2. Hyperthyroidism - followed by endocrine, she reports diagnosis of Graves diease   F/u 3 months     Antoine Poche, M.D.

## 2015-08-22 ENCOUNTER — Telehealth: Payer: Self-pay | Admitting: Cardiology

## 2015-08-22 NOTE — Telephone Encounter (Signed)
Diltiazem should be  po bid prn palpitations. It is not to be taken scheduled, only as needed but would not take more than twice in a day   JB

## 2015-08-22 NOTE — Telephone Encounter (Signed)
diltiazem (CARDIZEM) 30 MG tablet Needs clarification on how to take this medication

## 2015-08-22 NOTE — Telephone Encounter (Signed)
Patient stated that Dr. Wyline Mood told her during office visit that she was to only take the Diltiazem  as needed, NOT "TWICE A DAY" as per her check out sheet.  Informed patient that message will be sent to Dr. Wyline Mood for clarification.  Patient verbalized understanding & will hold on taking any till hear back from [place].

## 2015-08-23 MED ORDER — DILTIAZEM HCL 30 MG PO TABS: 30.0000 mg | ORAL_TABLET | ORAL | Status: DC | PRN

## 2015-08-23 NOTE — Telephone Encounter (Signed)
Patient notified.  Prescription changed at pharmacy & placed on file for future refills.

## 2015-09-10 ENCOUNTER — Telehealth: Payer: Self-pay | Admitting: *Deleted

## 2015-09-10 DIAGNOSIS — I4891 Unspecified atrial fibrillation: Secondary | ICD-10-CM

## 2015-09-10 NOTE — Telephone Encounter (Signed)
Pt says last Saturday had another "episode" of Afib that lasted all night and into Sunday night before relief, pt did take diltiazem 2 times that day but says it didn't help. Pt says she is ready to start anticoagulation now and would like some guidelines as to when to report to ED when having an episode. Will forward to Dr. Wyline Mood

## 2015-09-11 NOTE — Telephone Encounter (Signed)
Can we get the records from her GI doctors to follow up on the results of her testing. I'd like to see there notes regarding her anemia before starting her blood thinner. For her afib please varify she is taking her Toprol XL  bid. If so, they I would have her starting taking her ditliazem  bid every instead of just as needed. If she has palpitations ok to take additional diltiazem. Hopefully we can get things controlled with this combination of meds, if not we will need to consider another class of meds. Going to the ER is based on symptoms, if she has severe palpitations or chest pains then I would recommend going.    Dominga Ferry MD

## 2015-09-11 NOTE — Telephone Encounter (Signed)
Correction on diltiazem, pt will take diltiazem 30 mg BID instead of PRN

## 2015-09-11 NOTE — Telephone Encounter (Signed)
Pt had labs done 08/24/15 they are scanned into Epic. Pt is compliant on Toprol XL 100 mg BID and agreed to take diltiazem 30 mg BID daily instead of BID. Pt is clear on going to ED when having severe palpitations and/or chest pain.

## 2015-09-13 ENCOUNTER — Encounter: Payer: Self-pay | Admitting: *Deleted

## 2015-09-13 NOTE — Telephone Encounter (Signed)
Records requested from Dr. Merlinda Frederick office.

## 2015-09-13 NOTE — Telephone Encounter (Signed)
She had a workup for anemia including a colonscopy and perhaps an EGD as well. Are we able to get those records, I'd like those reports before starting blood thinner  Dominga Ferry MD

## 2015-09-18 NOTE — Telephone Encounter (Signed)
Records received and faxed to the [facility] office so that they can be reviewed by Dr. Wyline Mood.

## 2015-09-19 ENCOUNTER — Telehealth: Payer: Self-pay

## 2015-09-19 MED ORDER — APIXABAN 5 MG PO TABS: 5.0000 mg | ORAL_TABLET | Freq: Two times a day (BID) | ORAL | Status: DC

## 2015-09-19 NOTE — Telephone Encounter (Signed)
Please let patient know that records were received from her GI doctor and reviewed. We will go ahead and start her on anticoag with eliquis  bid. She can get samples if she likes from the office as well. Please start eliquis  bid. She does not need to take aspirin. Needs repeat CBC in 3 weeks.  Dominga Ferry MD

## 2015-09-19 NOTE — Telephone Encounter (Signed)
1 month supply Eliquis 5 mg supply provided for pt -Branch pt- ,see log out book

## 2015-09-19 NOTE — Telephone Encounter (Signed)
Pt will pick up samples of Eiliquis at [facility] office, mailed order for CBC in 3 weeks, also wrote down instructions for Eiliquis and Diltiazem as requested by pt. Pt verbalized understanding and will call with further problems/concerns

## 2015-09-21 ENCOUNTER — Telehealth: Payer: Self-pay | Admitting: *Deleted

## 2015-09-21 NOTE — Telephone Encounter (Signed)
Pt says after insurance, out of pocket expense would be $105/month for Eiliquis. Pt cannot afford this. Mailed pt assistance [employer] and pt agreeable to apply.

## 2015-09-21 NOTE — Telephone Encounter (Signed)
PA for Eliquis approved through 09/20/16 from [employer]. # [id number]. Faxed approval to pharmacy

## 2015-10-02 ENCOUNTER — Telehealth: Payer: Self-pay | Admitting: *Deleted

## 2015-10-02 MED ORDER — DILTIAZEM HCL 30 MG PO TABS: 30.0000 mg | ORAL_TABLET | Freq: Two times a day (BID) | ORAL | Status: DC

## 2015-10-02 NOTE — Telephone Encounter (Signed)
Pt says she is swelling under and around both eyes and has 4lbs weight gain since yesterday. Pt denies CP/dizziness/SOB and thinks it may be related to starting Eiliquis. Pt also requesting refill of diltiazem. Medication sent to pharmacy. Will forward to Dr. Wyline Mood

## 2015-10-03 NOTE — Telephone Encounter (Signed)
Swelling is not a common side effect of eliquis, I would doubt that its related. Has she taken any of her prn lasix to help. I would recommend continuing her eliquis and trying her prn lasix for the time being.  Dominga Ferry MD

## 2015-10-03 NOTE — Telephone Encounter (Signed)
Ok to double up on lasix today and tomorrow. If persists over the next few days can add her on for early next week   Dominga Ferry MD

## 2015-10-03 NOTE — Telephone Encounter (Signed)
Pt has been taking lasix daily for last 3 days

## 2015-10-03 NOTE — Telephone Encounter (Signed)
LM with instructions. Will f/u on Friday if pt doesn't return call

## 2015-10-04 NOTE — Telephone Encounter (Signed)
Pt returned call today, and says that swelling is better, so pt will not double lasix. Pt will take prn lasix as directed for swelling or weight gain, labs will be done in 2 weeks and pt will let us know so that we may obtain copy. Pt says she would like to come off some of her meds if labs are normal. Will f/u in 2 weeks

## 2015-10-05 ENCOUNTER — Other Ambulatory Visit: Payer: Self-pay | Admitting: *Deleted

## 2015-10-05 ENCOUNTER — Telehealth: Payer: Self-pay | Admitting: Cardiology

## 2015-10-05 MED ORDER — APIXABAN 5 MG PO TABS: 5.0000 mg | ORAL_TABLET | Freq: Two times a day (BID) | ORAL | Status: DC

## 2015-10-05 NOTE — Telephone Encounter (Signed)
Spoke with pharmacist at [facility] they didn't receive refill for diltiazem on 10/4. Gave verbal order for diltiazem 30 mg BID #60 with 3 refills. Pt made aware.

## 2015-10-05 NOTE — Telephone Encounter (Signed)
diltiazem (CARDIZEM) 30 MG tablet  Patient states that [facility] told her they have not received RX

## 2015-10-09 ENCOUNTER — Telehealth: Payer: Self-pay | Admitting: *Deleted

## 2015-10-09 NOTE — Telephone Encounter (Signed)
Pt approved through [employer] patient assistance program for Eliquis through 12/29/2015. Case # [id number].

## 2015-10-16 ENCOUNTER — Telehealth: Payer: Self-pay | Admitting: *Deleted

## 2015-10-16 ENCOUNTER — Ambulatory Visit (INDEPENDENT_AMBULATORY_CARE_PROVIDER_SITE_OTHER): Payer: Medicare Other | Admitting: *Deleted

## 2015-10-16 VITALS — BP 180/70 | Wt 211.0 lb

## 2015-10-16 DIAGNOSIS — I4891 Unspecified atrial fibrillation: Secondary | ICD-10-CM

## 2015-10-16 MED ORDER — DILTIAZEM HCL 60 MG PO TABS: 60.0000 mg | ORAL_TABLET | Freq: Two times a day (BID) | ORAL | Status: DC

## 2015-10-16 NOTE — Telephone Encounter (Signed)
[facility] for [facility] and nurse visit. Will be able to provide advice regarding medication titration afterwards. Any shortness of breath or dizziness?

## 2015-10-16 NOTE — Telephone Encounter (Signed)
-----   Message from Laqueta LindenSuresh A Koneswaran, MD sent at 10/16/2015  1:02 PM EDT ----- Increase diltiazem to 60 mg bid as HR not controlled.

## 2015-10-16 NOTE — Telephone Encounter (Signed)
Pt says feeling like she has been in "abnormal rhythm" since last night, says HR last night 106 173/132 BP. This AM BP 160/80 HR 100. Pt says she took extra dose of cardizem last night that helped her to go to sleep, and pt took another dose this morning. Pt taking BP while we were on phone and was 116/80 HR 137 and pt taking another dose of Cardizem now. Will forward to Dr. Purvis SheffieldKoneswaran with Dr Wyline MoodBranch out of office for further advise

## 2015-10-16 NOTE — Telephone Encounter (Signed)
Pt denies dizziness/SOB will come now for EKG nurse visit.

## 2015-10-16 NOTE — Progress Notes (Signed)
Pt denies CP/dizziness/SOB, says she is shaky, sweating, but relates this to anxiety and not feeling well since last night. Pt has gained 5lbs in 7 days, will take lasix today, has taken 60 mg Cardizem today and 4 tabs of metoprolol as directed will take another 4 tabs tonight. Ekg performed HR remains at 110. Will forward to Dr. Purvis SheffieldKoneswaran for further instructions. Pt declines to stay at office [place] [facility] [place] [facility] [place]

## 2015-10-16 NOTE — Telephone Encounter (Signed)
Pt aware and has already taken 60 mg this morning and will take 60 mg this evening. Pt will call back if HR still remains elevated. Will f/u as well. Medication sent to pharmacy and updated list

## 2015-10-22 ENCOUNTER — Encounter: Payer: Self-pay | Admitting: *Deleted

## 2015-10-24 ENCOUNTER — Telehealth: Payer: Self-pay | Admitting: *Deleted

## 2015-10-24 DIAGNOSIS — I4891 Unspecified atrial fibrillation: Secondary | ICD-10-CM

## 2015-10-24 NOTE — Telephone Encounter (Signed)
Pt is feeling worse and will have husband drive her to [facility]. Pt would like to know if any other medication change can be made, since this is happening every week.

## 2015-10-24 NOTE — Telephone Encounter (Signed)
Can she come see me tomorrow at 940?

## 2015-10-24 NOTE — Telephone Encounter (Signed)
BP 170/127 HR 130 pt feels she is in A-fib again, does not feel well, woke up this morning feeling this way, will forward to Dr. Wyline MoodBranch

## 2015-10-24 NOTE — Telephone Encounter (Signed)
Has she taken her meds this AM including her prn diltiazem. If symptoms are continuing she should go to the ER  Dominga FerryJ Erinn Huskins MD

## 2015-10-24 NOTE — Telephone Encounter (Signed)
Pt has taken all meds this AM, says she cannot afford to go to ED again. Dose of diltiazem was increased to 60 mg BID

## 2015-10-25 ENCOUNTER — Other Ambulatory Visit: Payer: Self-pay | Admitting: *Deleted

## 2015-10-25 MED ORDER — DILTIAZEM HCL ER 60 MG PO CP12: 60.0000 mg | ORAL_CAPSULE | Freq: Three times a day (TID) | ORAL | Status: DC

## 2015-10-25 MED ORDER — DILTIAZEM HCL 60 MG PO TABS: 60.0000 mg | ORAL_TABLET | Freq: Three times a day (TID) | ORAL | Status: DC

## 2015-10-25 NOTE — Telephone Encounter (Signed)
Pt did not go to [facility] yesterday, says she took an extra diltiazem, klonopin, and slept. At 4 pm yesterday she felt better, but felt "hungover" this AM. BP this morning is 150/80. Spoke with Dr. Wyline MoodBranch, will refer to A-fib clinic and await med change until pt can be seen in A-fib clinic.

## 2015-10-25 NOTE — Telephone Encounter (Signed)
Patient should go up to dilt 60mg  three times a day until we can get her into afib clinic. Likely at that appointment they will consider a different class of medicine for her, something called an antiarrhythmic which will work to try to keep her out of afib.  Dominga FerryJ Jona Zappone MD

## 2015-10-25 NOTE — Telephone Encounter (Signed)
Spoke with Kennyth ArnoldStacy at A-fib clinic appt set for 10/26/15 @ 11:30AM. Pt aware and given phone number and directions to clinic. Appreciative of call.

## 2015-10-26 ENCOUNTER — Encounter (HOSPITAL_COMMUNITY): Payer: Self-pay | Admitting: Nurse Practitioner

## 2015-10-26 ENCOUNTER — Ambulatory Visit (HOSPITAL_COMMUNITY)
Admission: RE | Admit: 2015-10-26 | Discharge: 2015-10-26 | Disposition: A | Discharge status: Home / Self Care | Payer: Medicare Other | Source: Ambulatory Visit | Attending: Nurse Practitioner | Admitting: Nurse Practitioner

## 2015-10-26 VITALS — BP 160/70 | HR 81 | Ht 67.0 in | Wt 207.4 lb

## 2015-10-26 DIAGNOSIS — I481 Persistent atrial fibrillation: Secondary | ICD-10-CM | POA: Diagnosis present

## 2015-10-26 DIAGNOSIS — E0500 Thyrotoxicosis with diffuse goiter without thyrotoxic crisis or storm: Secondary | ICD-10-CM | POA: Insufficient documentation

## 2015-10-26 DIAGNOSIS — I480 Paroxysmal atrial fibrillation: Secondary | ICD-10-CM | POA: Diagnosis not present

## 2015-10-26 MED ORDER — SOTALOL HCL 80 MG PO TABS: 80.0000 mg | ORAL_TABLET | Freq: Two times a day (BID) | ORAL | Status: DC

## 2015-10-26 NOTE — Progress Notes (Addendum)
Patient ID: Veronica Harrison, female   DOB: 10/09/1951, 64 y.o.   MRN: [id number]     Primary Care Physician: Arma HeadingISERNIA,JAMES, MD Referring Physician: Dr. Jillene BucksBranch   Veronica Harrison is a 64 y.o. female with a h/o Graves disease, PAF, HTN, Psoriatic arthritis for evaluation in the afib clinic for high afib burden. [place] onset afib with rvr with v rates in ther 140-150's. She was also found to be hyperthyroid dx'ed with Graves Disease. She just swallowed the "capsule" for treatment of her thyroid 10/10 but per pt may take 3 months to normalize. She is currently in SR but was in afib early today, this episode only lasting 2-3 hours. Usually, she is in it all day and she has to go to bed because she feels so poorly. No h/o sleep apnea, no alcohol use, no high caffeine use. Unable to be very active due to joint issues.  Today, she denies symptoms of palpitations, chest pain, shortness of breath, orthopnea, PND, lower extremity edema, dizziness, presyncope, syncope, or neurologic sequela. The patient is tolerating medications without difficulties and is otherwise without complaint today.   Past Medical History  Diagnosis Date  . Type 2 diabetes mellitus (HCC)   . Hypertension   . Psoriatic arthritis (HCC)   . Chronic pain   . Atrial fibrillation (HCC)   . Vitamin D deficiency   . Graves' disease    Past Surgical History  Procedure Laterality Date  . Varicose vein surgery Left   . Cholecystectomy    . Total knee arthroplasty Right   . Abdominal hysterectomy      Current Outpatient Prescriptions  Medication Sig Dispense Refill  . acetaminophen (TYLENOL) 500 MG tablet Take 500 mg by mouth 2 (two) times daily as needed.    Marland Kitchen. albuterol (PROVENTIL) (2.5 MG/3ML) 0.083% nebulizer solution Take 2.5 mg by nebulization every 6 (six) hours as needed for wheezing or shortness of breath.    Marland Kitchen. apixaban (ELIQUIS) 5 MG TABS tablet Take 1 tablet (5 mg total) by  mouth 2 (two) times daily. 180 tablet 3  . bisacodyl (BISACODYL) 5 MG EC tablet Take 5 mg by mouth 2 (two) times daily as needed for moderate constipation.    . clidinium-chlordiazePOXIDE (LIBRAX) 5-2.5 MG per capsule Take 1 capsule by mouth daily.     . clindamycin (CLEOCIN) 150 MG capsule Take 600 mg by mouth as directed. Prior to dentist appointments    . clonazePAM (KLONOPIN) 2 MG tablet Take 1 mg by mouth at bedtime.     . cyanocobalamin (,VITAMIN B-12,) 1000 MCG/ML injection Inject 1,000 mcg into the muscle every 30 (thirty) days.     Marland Kitchen. diltiazem (CARDIZEM) 60 MG tablet Take 1 tablet (60 mg total) by mouth 3 (three) times daily. (Patient taking differently: Take 60 mg by mouth 3 (three) times daily. Take 2 tablets 3 times a day.) 90 tablet 3  . Fluticasone-Salmeterol (ADVAIR) 250-50 MCG/DOSE AEPB Inhale 1 puff into the lungs as needed.    . folic acid (FOLVITE) 1 MG tablet Take 1 mg by mouth 2 (two) times daily.    . furosemide (LASIX) 20 MG tablet Take one tablet once a day as needed for swelling or shortness of breath. 30 tablet 6  . glucose blood test strip 1 each by Other route as needed for other. Use as instructed    . Iron-Vitamin C (VITRON-C PO) Take 1 tablet by mouth daily.    . Lancets  28G MISC 28 g by Does not apply route daily.    . magnesium oxide (MAG-OX) 400 MG tablet Take 400 mg by mouth 2 (two) times daily.    . metFORMIN (GLUCOPHAGE) 1000 MG tablet Take 1,000 mg by mouth 2 (two) times daily with a meal.    . Methotrexate, PF, 30 MG/0.6ML SOAJ Inject 20 mg into the skin once a week.     . metoprolol succinate (TOPROL XL) 25 MG 24 hr tablet Take 4 tablets (100 mg total) by mouth 2 (two) times daily. 240 tablet 3  . oxyCODONE (ROXICODONE) 15 MG immediate release tablet Take 15 mg by mouth as needed for pain. Take 1 1/2 tablet four times a day    . potassium chloride (KLOR-CON) 20 MEQ packet Take 20 mEq by mouth 2 (two) times daily.    . promethazine (PHENERGAN) 25 MG tablet  Take 25 mg by mouth as needed for nausea or vomiting. Take 1 tablet four times a day as needed    . triamcinolone (NASACORT AQ) 55 MCG/ACT AERO nasal inhaler Place 1 spray into the nose daily.    . Tuberculin-Allergy Syringes (B-D TB SYRINGE 1CC/22GX1") 22G X 1" 1 ML MISC by Does not apply route. Use as directed    . Ustekinumab (STELARA) 90 MG/ML SOSY Inject into the skin as needed. As needed for 3 months    . sotalol (BETAPACE) 80 MG tablet Take 1 tablet (80 mg total) by mouth 2 (two) times daily. 60 tablet 3   No current facility-administered medications for this encounter.    Allergies  Allergen Reactions  . Penicillins Anaphylaxis  . Strawberry Extract Anaphylaxis  . Ace Inhibitors Other (See Comments)    Feeling Faint     Social History   Social History  . Marital Status: Married    Spouse Name: N/A  . Number of Children: N/A  . Years of Education: N/A   Occupational History  . Not on file.   Social History Main Topics  . Smoking status: Former Smoker -- 1.00 packs/day for 34 years    Types: Cigarettes    Start date: 01/29/1970    Quit date: 09/30/2004  . Smokeless tobacco: Never Used     Comment: 10 years ago  . Alcohol Use: Not on file  . Drug Use: Not on file  . Sexual Activity: Not on file   Other Topics Concern  . Not on file   Social History Narrative    Family History  Problem Relation Age of Onset  . Diabetes    . CAD      ROS- All systems are reviewed and negative except as per the HPI above  Physical Exam: Filed Vitals:   10/26/15 1116  BP: 160/70  Pulse: 81  Height:  (1.702 m)  Weight: 207 lb 6.4 oz (94.076 kg)    GEN- The patient is well appearing, alert and oriented x 3 today.   Head- normocephalic, atraumatic Eyes-  Sclera clear, conjunctiva pink Ears- hearing intact Oropharynx- clear Neck- supple, no JVP Lymph- no cervical lymphadenopathy Lungs- Clear to ausculation bilaterally, normal work of breathing Heart- Regular  rate and rhythm, no murmurs, rubs or gallops, PMI not laterally displaced GI- soft, NT, ND, + BS Extremities- no clubbing, cyanosis, or edema MS- no significant deformity or atrophy Skin- no rash or lesion Psych- euthymic mood, full affect Neuro- strength and sensation are intact  EKG- NSR, normal  EKG, v rate 81 bpm, pr int 140 ms,  qrs int 70 ms, qtc 415 ms.  Unable to see any records re thyroid treatment in [facility].   Assessment and Plan: 1. Persistent symptomatic afib  Discussed possible antiarrythmic options to help maintain SR, multaq would be appropriate to use, but checked with her pharmacist and will cost pt $200 a month which will be a hardship on her. She is not a candidate for flecainide/rythmol(1c agents) with moderate LVH. As long as she is maintaining sotalol, Dr. Johney Frame suggests sotalol 80 mg bid to be started as an outpt, but can only be started as outpt if she continues in SR. Amiodarone is not an option due to thyroid issues and tikosyn may again be too expensive , cost of drug, plus the necessary hospitalization.   2. Graves disease Under treatment for same Most likely is  contributing to afib burden  Is going to see new endocrinologist in [place], Nov 17th   She will continue with current doses of cardizem and BB, but cautioned if HR falls below 60 with sotalol may have to reduce rate control meds. She is to return on Monday for repeat EKG.  Pt was discussed wirh Dr. Johney Frame  Addendum-10/31-Labs received from PCP from 6/16, show normal creatinine at 0.61  BUN 9, Kt 3.9, GFR >60. Liver enzymes normal, TSH-0.11, Free T4 1.64, HGB 10.2, Hematocrit 32.8 (chronic)  [facility] [address] [place], [place] [address] [phone]

## 2015-10-26 NOTE — Patient Instructions (Signed)
Your physician has recommended you make the following change in your medication:  1)Sotalol 80mg  twice daily  If heart rate starts going below 60 decrease cardizem.

## 2015-10-29 ENCOUNTER — Inpatient Hospital Stay (HOSPITAL_COMMUNITY): Admission: RE | Admit: 2015-10-29 | Payer: Medicare Other | Source: Ambulatory Visit | Admitting: Nurse Practitioner

## 2015-10-29 NOTE — Addendum Note (Signed)
Encounter addended by: Newman Niponna C Mackensi Mahadeo, NP on: 10/29/2015  3:16 PM<BR>     Documentation filed: Notes Section

## 2015-10-30 ENCOUNTER — Ambulatory Visit (HOSPITAL_COMMUNITY)
Admission: RE | Admit: 2015-10-30 | Discharge: 2015-10-30 | Disposition: A | Discharge status: Home / Self Care | Payer: Medicare Other | Source: Ambulatory Visit | Attending: Nurse Practitioner | Admitting: Nurse Practitioner

## 2015-10-30 DIAGNOSIS — I480 Paroxysmal atrial fibrillation: Secondary | ICD-10-CM | POA: Diagnosis not present

## 2015-10-30 DIAGNOSIS — I4891 Unspecified atrial fibrillation: Secondary | ICD-10-CM | POA: Insufficient documentation

## 2015-10-30 LAB — BASIC METABOLIC PANEL
ANION GAP: 7 (ref 5–15)
BUN: <5 mg/dL — ABNORMAL LOW (ref 6–20)
CO2: 29 mmol/L (ref 22–32)
Calcium: 9.4 mg/dL (ref 8.9–10.3)
Chloride: 99 mmol/L — ABNORMAL LOW (ref 101–111)
Creatinine, Ser: 0.57 mg/dL (ref 0.44–1.00)
GFR calc Af Amer: >60 mL/min (ref >60)
GFR calc non Af Amer: >60 mL/min (ref >60)
GLUCOSE: 110 mg/dL — AB (ref 65–99)
POTASSIUM: 4.1 mmol/L (ref 3.5–5.1)
Sodium: 135 mmol/L (ref 135–145)

## 2015-10-30 NOTE — Progress Notes (Addendum)
Pt's visit is EKG and lab work only. Lupita LeashDonna will review the EKG with pt.   Pt started on sotalol  80 mg bid and is here  3 days later for f/u EKG. She has been staying in SR, pr int 146 ms, qrs int 80 ms, qtc 394 ms. She will be given a f/u appointment with Dr. Wyline MoodBranch in 2 weeks. She is still having BP problems. Hyperthyroidism is still under treatment. She will watch for bradycardia and report if her heart rate gets below 50 bpm. Will see in afib clinic as needed.

## 2015-10-31 ENCOUNTER — Encounter: Payer: Self-pay | Admitting: *Deleted

## 2015-11-08 ENCOUNTER — Encounter: Payer: Self-pay | Admitting: *Deleted

## 2015-11-12 ENCOUNTER — Encounter: Payer: Self-pay | Admitting: Cardiology

## 2015-11-12 ENCOUNTER — Ambulatory Visit (INDEPENDENT_AMBULATORY_CARE_PROVIDER_SITE_OTHER): Payer: Medicare Other | Admitting: Cardiology

## 2015-11-12 VITALS — BP 148/72 | HR 76 | Ht 67.0 in | Wt 202.0 lb

## 2015-11-12 DIAGNOSIS — I480 Paroxysmal atrial fibrillation: Secondary | ICD-10-CM | POA: Diagnosis not present

## 2015-11-12 NOTE — Progress Notes (Signed)
Patient ID: ARYAM ZHAN, female   DOB: 02/20/1951, 64 y.o.   MRN: [id number]     Clinical Summary Ms. Teo is a 64 y.o.female seen today for follow up of the following medical problems. This is a focused visit on her history of afib, for more detailed history please refer to prior clinic notes.   1. Afib  - we were unable to control her symptoms with up titration of AV nodal agents. She was referred to afib clinic for antiarrhythmic consideration. Started on solatol  bid in afib clinic 10/26/15. Baseline QTc was 415, f/u EKG 1 week after starting sotalol QTc around 400.  - she initially did well, however she reports an 18 hour episode of palpitations on Friday. - she is also currently being treated for Graves disease  Past Medical History  Diagnosis Date  . Type 2 diabetes mellitus (HCC)   . Hypertension   . Psoriatic arthritis (HCC)   . Chronic pain   . Atrial fibrillation (HCC)   . Vitamin D deficiency   . Graves' disease      Allergies  Allergen Reactions  . Penicillins Anaphylaxis  . Strawberry Extract Anaphylaxis  . Ace Inhibitors Other (See Comments)    Feeling Faint      Current Outpatient Prescriptions  Medication Sig Dispense Refill  . acetaminophen (TYLENOL) 500 MG tablet Take 500 mg by mouth 2 (two) times daily as needed.    Marland Kitchen albuterol (PROVENTIL) (2.5 MG/3ML) 0.083% nebulizer solution Take 2.5 mg by nebulization every 6 (six) hours as needed for wheezing or shortness of breath.    Marland Kitchen apixaban (ELIQUIS) 5 MG TABS tablet Take 1 tablet (5 mg total) by mouth 2 (two) times daily. 180 tablet 3  . bisacodyl (BISACODYL) 5 MG EC tablet Take 5 mg by mouth 2 (two) times daily as needed for moderate constipation.    . clidinium-chlordiazePOXIDE (LIBRAX) 5-2.5 MG per capsule Take 1 capsule by mouth daily.     . clindamycin (CLEOCIN) 150 MG capsule Take 600 mg by mouth as directed. Prior to dentist appointments    . clonazePAM (KLONOPIN) 2 MG tablet Take 1 mg by  mouth at bedtime.     . cyanocobalamin (,VITAMIN B-12,) 1000 MCG/ML injection Inject 1,000 mcg into the muscle every 30 (thirty) days.     Marland Kitchen diltiazem (CARDIZEM) 60 MG tablet Take 1 tablet (60 mg total) by mouth 3 (three) times daily. (Patient taking differently: Take 60 mg by mouth 3 (three) times daily. Take 2 tablets 3 times a day.) 90 tablet 3  . Fluticasone-Salmeterol (ADVAIR) 250-50 MCG/DOSE AEPB Inhale 1 puff into the lungs as needed.    . folic acid (FOLVITE) 1 MG tablet Take 1 mg by mouth 2 (two) times daily.    . furosemide (LASIX) 20 MG tablet Take one tablet once a day as needed for swelling or shortness of breath. 30 tablet 6  . glucose blood test strip 1 each by Other route as needed for other. Use as instructed    . Iron-Vitamin C (VITRON-C PO) Take 1 tablet by mouth daily.    . Lancets 28G MISC 28 g by Does not apply route daily.    . magnesium oxide (MAG-OX) 400 MG tablet Take 400 mg by mouth 2 (two) times daily.    . metFORMIN (GLUCOPHAGE) 1000 MG tablet Take 1,000 mg by mouth 2 (two) times daily with a meal.    . Methotrexate, PF, 30 MG/0.6ML SOAJ Inject 20 mg into the skin  once a week.     . metoprolol succinate (TOPROL XL) 25 MG 24 hr tablet Take 4 tablets (100 mg total) by mouth 2 (two) times daily. 240 tablet 3  . oxyCODONE (ROXICODONE) 15 MG immediate release tablet Take 15 mg by mouth as needed for pain. Take 1 1/2 tablet four times a day    . potassium chloride (KLOR-CON) 20 MEQ packet Take 20 mEq by mouth 2 (two) times daily.    . promethazine (PHENERGAN) 25 MG tablet Take 25 mg by mouth as needed for nausea or vomiting. Take 1 tablet four times a day as needed    . sotalol (BETAPACE) 80 MG tablet Take 1 tablet (80 mg total) by mouth 2 (two) times daily. 60 tablet 3  . triamcinolone (NASACORT AQ) 55 MCG/ACT AERO nasal inhaler Place 1 spray into the nose daily.    . Tuberculin-Allergy Syringes (B-D TB SYRINGE 1CC/22GX1") 22G X 1" 1 ML MISC by Does not apply route. Use as  directed    . Ustekinumab (STELARA) 90 MG/ML SOSY Inject into the skin as needed. As needed for 3 months     No current facility-administered medications for this visit.     Past Surgical History  Procedure Laterality Date  . Varicose vein surgery Left   . Cholecystectomy    . Total knee arthroplasty Right   . Abdominal hysterectomy       Allergies  Allergen Reactions  . Penicillins Anaphylaxis  . Strawberry Extract Anaphylaxis  . Ace Inhibitors Other (See Comments)    Feeling Faint       Family History  Problem Relation Age of Onset  . Diabetes    . CAD       Social History Ms. Lajean SilviusBateman reports that she quit smoking about 11 years ago. Her smoking use included Cigarettes. She started smoking about 45 years ago. She has a 34 pack-year smoking history. She has never used smokeless tobacco. Ms. Lajean SilviusBateman has no alcohol history on file.   Review of Systems CONSTITUTIONAL: No weight loss, fever, chills, weakness or fatigue.  HEENT: Eyes: No visual loss, blurred vision, double vision or yellow sclerae.No hearing loss, sneezing, congestion, runny nose or sore throat.  SKIN: No rash or itching.  CARDIOVASCULAR: per HPI RESPIRATORY: No shortness of breath, cough or sputum.  GASTROINTESTINAL: No anorexia, nausea, vomiting or diarrhea. No abdominal pain or blood.  GENITOURINARY: No burning on urination, no polyuria NEUROLOGICAL: No headache, dizziness, syncope, paralysis, ataxia, numbness or tingling in the extremities. No change in bowel or bladder control.  MUSCULOSKELETAL: No muscle, back pain, joint pain or stiffness.  LYMPHATICS: No enlarged nodes. No history of splenectomy.  PSYCHIATRIC: No history of depression or anxiety.  ENDOCRINOLOGIC: No reports of sweating, cold or heat intolerance. No polyuria or polydipsia.  Marland Kitchen.   Physical Examination Filed Vitals:   11/12/15 1442  BP: 148/72  Pulse: 76   Filed Vitals:   11/12/15 1442  Height: 5\' 7"  (1.702 m)  Weight:  202 lb (91.627 kg)    Gen: resting comfortably, no acute distress HEENT: no scleral icterus, pupils equal round and reactive, no palptable cervical adenopathy,  CV: RRR, no m/r/g, no jvd Resp: Clear to auscultation bilaterally GI: abdomen is soft, non-tender, non-distended, normal bowel sounds, no hepatosplenomegaly MSK: extremities are warm, no edema.  Skin: warm, no rash Neuro:  no focal deficits Psych: appropriate affect   Diagnostic Studies 07/2014 Echo Study Conclusions  - Left ventricle: The cavity size was normal. Systolic function  was normal. The estimated ejection fraction was in the range of 60% to 65%. Wall motion was normal; there were no regional wall motion abnormalities. Features are consistent with a pseudonormal left ventricular filling pattern, with concomitant abnormal relaxation and increased filling pressure (grade 2 diastolic dysfunction). Moderate posterior wall and mild septal hypertrophy. - Mitral valve: There was trivial regurgitation. - Left atrium: The atrium was mildly dilated.  06/2015 Holter monitor [facility]: short episodes of afib with RVR    Assessment and Plan  1. Afib - unable to control with rate control strategy. Referred to afib clinic, there she was started on sotalol  bid. QTc remains around 400. Continues to have symptoms, I messaged afib clinic and there recs are to increase sotalol to  bid and repeat EKG in 36 hours. We will arrange.    F/u 3-4 weeks       Antoine Poche, M.D.

## 2015-11-12 NOTE — Patient Instructions (Signed)
YOUR INSTRUCTIONS WILL BE DETERMINED AFTER DR. BRANCH CONSULTS WITH THE A-FIB CLINIC. WE WILL CALL YOU WITH FURTHER INSTRUCTIONS.   Thank you for choosing [facility]!!

## 2015-11-14 ENCOUNTER — Telehealth: Payer: Self-pay | Admitting: *Deleted

## 2015-11-14 MED ORDER — SOTALOL HCL 120 MG PO TABS: 120.0000 mg | ORAL_TABLET | Freq: Two times a day (BID) | ORAL | Status: DC

## 2015-11-14 NOTE — Telephone Encounter (Signed)
-----   Message from Antoine PocheJonathan F Branch, MD sent at 11/14/2015 11:07 AM EST ----- Please increase patient sotalol to 120mg  bid and have her come in Friday for repeat EKG. (she must be able to have EKG done Friday if we are going to increase dose)  Dominga FerryJ Branch MD ----- Message -----    From: Newman Niponna C Carroll, NP    Sent: 11/13/2015   4:09 PM      To: Antoine PocheJonathan F Branch, MD  Dr. Wyline MoodBranch, I reviewed this with Dr. Johney FrameAllred and he said you can increase to 120 mg of sotalol  bid and bring back for EKG in 36 hours. Lupita Leashonna ----- Message -----    From: Antoine PocheJonathan F Branch, MD    Sent: 11/12/2015   3:14 PM      To: Newman Niponna C Carroll, NP  Mutual patient of ours recently started on sotalol 80mg  bid. QTc today looks fine, however she had an 18 hr episode of afib this past Friday. Would you guys recommend increasing sotalol or consider alternative antiarrhythmic?   Dominga FerryJ Branch MD

## 2015-11-14 NOTE — Telephone Encounter (Signed)
Patient notified.  Will send [facility] today.  Nurse visit for EKG scheduled for Friday, 11/16/2015 at 9:30 am.  Okay per Dr. Wyline MoodBranch to leave appointment as already scheduled - 11/27/2015.

## 2015-11-14 NOTE — Telephone Encounter (Addendum)
Left message to return call.  (home #)  =============================================  Antoine PocheJonathan F Branch, MD  Lesle ChrisAngela G Ryot Burrous, LPN    Can this patient (the one we changed sotalol and coming for EKG Friday) f/u with me in 3-4 weeks   Dominga FerryJ Branch MD

## 2015-11-15 ENCOUNTER — Ambulatory Visit (INDEPENDENT_AMBULATORY_CARE_PROVIDER_SITE_OTHER): Payer: Medicare Other | Admitting: "Endocrinology

## 2015-11-15 ENCOUNTER — Encounter: Payer: Self-pay | Admitting: "Endocrinology

## 2015-11-15 DIAGNOSIS — E079 Disorder of thyroid, unspecified: Secondary | ICD-10-CM | POA: Diagnosis not present

## 2015-11-15 DIAGNOSIS — E0590 Thyrotoxicosis, unspecified without thyrotoxic crisis or storm: Secondary | ICD-10-CM

## 2015-11-15 NOTE — Progress Notes (Signed)
Subjective:    Patient ID: Veronica Harrison, female    DOB: [birth year],    Past Medical History  Diagnosis Date  . Type 2 diabetes mellitus (HCC)   . Hypertension   . Psoriatic arthritis (HCC)   . Chronic pain   . Atrial fibrillation (HCC)   . Vitamin D deficiency   . Graves' disease    Past Surgical History  Procedure Laterality Date  . Varicose vein surgery Left   . Cholecystectomy    . Total knee arthroplasty Right   . Abdominal hysterectomy     Social History   Social History  . Marital Status: Married    Spouse Name: N/A  . Number of Children: N/A  . Years of Education: N/A   Social History Main Topics  . Smoking status: Former Smoker -- 1.00 packs/day for 34 years    Types: Cigarettes    Start date: 01/29/1970    Quit date: 09/30/2004  . Smokeless tobacco: Never Used     Comment: 10 years ago  . Alcohol Use: No  . Drug Use: No  . Sexual Activity: Not Asked   Other Topics Concern  . None   Social History Narrative   Outpatient Encounter Prescriptions as of 11/15/2015  Medication Sig  . acetaminophen (TYLENOL) 500 MG tablet Take 500 mg by mouth 2 (two) times daily as needed.  Marland Kitchen albuterol (PROVENTIL) (2.5 MG/3ML) 0.083% nebulizer solution Take 2.5 mg by nebulization every 6 (six) hours as needed for wheezing or shortness of breath.  Marland Kitchen apixaban (ELIQUIS) 5 MG TABS tablet Take 1 tablet (5 mg total) by mouth 2 (two) times daily.  . bisacodyl (BISACODYL) 5 MG EC tablet Take 5 mg by mouth 2 (two) times daily as needed for moderate constipation.  . clidinium-chlordiazePOXIDE (LIBRAX) 5-2.5 MG per capsule Take 1 capsule by mouth daily.   . clindamycin (CLEOCIN) 150 MG capsule Take 600 mg by mouth as directed. Prior to dentist appointments  . clonazePAM (KLONOPIN) 2 MG tablet Take 1 mg by mouth at bedtime.   . cyanocobalamin (,VITAMIN B-12,) 1000 MCG/ML injection Inject 1,000 mcg into the muscle every 30 (thirty) days.   Marland Kitchen diltiazem (CARDIZEM) 60 MG tablet  Take 1 tablet (60 mg total) by mouth 3 (three) times daily. (Patient taking differently: Take 60 mg by mouth 3 (three) times daily. Take 2 tablets 3 times a day.)  . esomeprazole (NEXIUM) 40 MG capsule Take 40 mg by mouth daily at 12 noon.  . Fluticasone-Salmeterol (ADVAIR) 250-50 MCG/DOSE AEPB Inhale 1 puff into the lungs as needed.  . folic acid (FOLVITE) 1 MG tablet Take 1 mg by mouth 2 (two) times daily.  . furosemide (LASIX) 20 MG tablet Take one tablet once a day as needed for swelling or shortness of breath.  Marland Kitchen glucose blood test strip 1 each by Other route as needed for other. Use as instructed  . Iron-Vitamin C (VITRON-C PO) Take 1 tablet by mouth daily.  . Lancets 28G MISC 28 g by Does not apply route daily.  . magnesium oxide (MAG-OX) 400 MG tablet Take 400 mg by mouth 2 (two) times daily.  . metFORMIN (GLUCOPHAGE) 1000 MG tablet Take 1,000 mg by mouth 2 (two) times daily with a meal.  . Methotrexate, PF, 30 MG/0.6ML SOAJ Inject 20 mg into the skin once a week.   . metoprolol succinate (TOPROL XL) 25 MG 24 hr tablet Take 4 tablets (100 mg total) by mouth 2 (two) times daily.  Marland Kitchen  oxyCODONE (ROXICODONE) 15 MG immediate release tablet Take 15 mg by mouth as needed for pain. Take 1 1/2 tablet four times a day  . promethazine (PHENERGAN) 25 MG tablet Take 25 mg by mouth as needed for nausea or vomiting. Take 1 tablet four times a day as needed  . sotalol (BETAPACE) 120 MG tablet Take 1 tablet (120 mg total) by mouth 2 (two) times daily.  Marland Kitchen triamcinolone (NASACORT AQ) 55 MCG/ACT AERO nasal inhaler Place 1 spray into the nose daily.  . Tuberculin-Allergy Syringes (B-D TB SYRINGE 1CC/22GX1") 22G X 1" 1 ML MISC by Does not apply route. Use as directed  . Ustekinumab (STELARA) 90 MG/ML SOSY Inject into the skin as needed. As needed for 3 months  . potassium chloride (KLOR-CON) 20 MEQ packet Take 20 mEq by mouth 2 (two) times daily.   No facility-administered encounter medications on file as of  11/15/2015.   ALLERGIES: Allergies  Allergen Reactions  . Penicillins Anaphylaxis  . Strawberry Extract Anaphylaxis  . Ace Inhibitors Other (See Comments)    Feeling Faint   . Pseudoephedrine Other (See Comments)    Makes heart race   VACCINATION STATUS:  There is no immunization history on file for this patient.  HPI  The patient presents today with a medical history as above, and is being seen in consultation for hyperthyroidism requested by Dr. Arma Heading.  The patient has been dealing with symptoms of palpitations, tremors, and fatigue for approximately 6 months prior to April 2016 at which time she was diagnosed with Graves' disease and treated with RAI thyroid ablation. Record of treatment is not available to review however on 04/25/2015 her thyroid uptake and scan was performed which showed uptake of 40.5% uniform uptake consistent with Graves' disease. -She has observed symptomatic improvement since therapy, however she was not initiated on thyroid hormone. She does not have recent thyroid function test to review. During her last thyroid function testing June 2016 she was not hypothyroid.   The patient has family history of thyroid dysfunction with various types in 3 sisters. - the patient denies personal history of goiter. Patient is not on any anti-thyroid medications nor on any thyroid hormone supplements currently. Patient  is willing to proceed with appropriate work up and therapy for thyrotoxicosis.   Review of Systems  Constitutional: + weight gain, no fatigue, no subjective hyperthermia/hypothermia Eyes: no blurry vision, no xerophthalmia ENT: no sore throat, no nodules palpated in throat, no dysphagia/odynophagia, no hoarseness Cardiovascular: no CP/SOB/palpitations/leg swelling Respiratory: no cough/SOB Gastrointestinal: no N/V/D/C Musculoskeletal: no muscle/ +joint aches Skin: no rashes Neurological: no tremors/numbness/tingling/dizziness Psychiatric: no  depression/anxiety   Objective:    BP 146/76 mmHg  Pulse 74  Ht  (1.702 m)  Wt 199 lb (90.266 kg)  BMI 31.16 kg/m2  SpO2 95%  Wt Readings from Last 3 Encounters:  11/15/15 199 lb (90.266 kg)  11/12/15 202 lb (91.627 kg)  10/26/15 207 lb 6.4 oz (94.076 kg)    Physical Exam  Constitutional: overweight, in NAD Eyes: PERRLA, EOMI, no exophthalmos ENT: moist mucous membranes, no thyromegaly, no cervical lymphadenopathy Cardiovascular: RRR, No MRG Respiratory: CTA B Gastrointestinal: abdomen soft, NT, ND, BS+ Musculoskeletal: she has surgical scar on right knee, moderately tender , swollen left knee. strength intact in all 4 Skin: moist, warm, no rashes Neurological: no tremor with outstretched hands, DTR normal in all 4  Results for orders placed or performed during the hospital encounter of 10/30/15  Basic metabolic panel  Result  Value Ref Range   Sodium 135 135 - 145 mmol/L   Potassium 4.1 3.5 - 5.1 mmol/L   Chloride 99 (L) 101 - 111 mmol/L   CO2 29 22 - 32 mmol/L   Glucose, Bld 110 (H) 65 - 99 mg/dL   BUN <5 (L) 6 - 20 mg/dL   Creatinine, Ser 1.610.57 0.44 - 1.00 mg/dL   Calcium 9.4 8.9 - 09.610.3 mg/dL   GFR calc non Af Amer >60 >60 mL/min   GFR calc Af Amer >60 >60 mL/min   Anion gap 7 5 - 15   Complete Blood Count (Most recent): No results found for: WBC, HGB, HCT, MCV, PLT Chemistry (most recent): Lab Results  Component Value Date   NA 135 10/30/2015   K 4.1 10/30/2015   CL 99* 10/30/2015   CO2 29 10/30/2015   BUN <5* 10/30/2015   CREATININE 0.57 10/30/2015     Assessment & Plan:   1. Overactive thyroid gland  Patient's history and most recent labs are reviewed. 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 [facility]. -I will send her to lab to obtain fresh set of thyroid function test today. -The  expectation is that she will have RAI induced hypothyroidism which would require thyroid hormone replacement. This is discussed in detail with her. -If thyroid function tests indicate persistent hyperthyroidism she will be considered for repeat therapy with RAI. She is particularly important given her history of atrial fibrillation.  The patient will return in 1 week for treatment decision. -She has several other medical conditions including controlled type 2 diabetes I have advised her to continue close follow-up with her primary medical doctor.  Follow up plan: Return in about 1 week (around 11/22/2015) for overactive thyroid.  Marquis LunchGebre Deanda Ruddell, MD Phone: [phone]  Fax: [phone]   11/15/2015, 7:04 PM

## 2015-11-16 ENCOUNTER — Ambulatory Visit (INDEPENDENT_AMBULATORY_CARE_PROVIDER_SITE_OTHER): Payer: Medicare Other | Admitting: *Deleted

## 2015-11-16 VITALS — Wt 198.0 lb

## 2015-11-16 DIAGNOSIS — I4891 Unspecified atrial fibrillation: Secondary | ICD-10-CM | POA: Diagnosis not present

## 2015-11-16 LAB — T3, FREE: T3 FREE: 10.1 pg/mL — AB (ref 2.3–4.2)

## 2015-11-16 LAB — T4, FREE: FREE T4: 3.52 ng/dL — AB (ref 0.80–1.80)

## 2015-11-16 LAB — TSH

## 2015-11-16 NOTE — Progress Notes (Signed)
Pt here for repeat EKG since increase on Sotalol. Pt asymptomatic today. Since increasing Sotalol 150 bid pt says "no episodes of a-fib". Pt says she has been losing some weight and has lost 4 lbs since LOV 11/14, but says appetite has decreased in the last 2 weeks. EKG reviewed by Dr Wyline MoodBranch and QTc at goal. Pt has upcoming appt 11/29 @1pm .

## 2015-11-16 NOTE — Patient Instructions (Signed)
No medication changes, continue taking Sotalol 120 mg twice daily. F/u appt 11/29 with Dr. Wyline MoodBranch @ 1pm.

## 2015-11-27 ENCOUNTER — Ambulatory Visit (INDEPENDENT_AMBULATORY_CARE_PROVIDER_SITE_OTHER): Payer: Medicare Other | Admitting: Cardiology

## 2015-11-27 ENCOUNTER — Encounter: Payer: Self-pay | Admitting: Cardiology

## 2015-11-27 VITALS — BP 147/70 | HR 62 | Ht 67.0 in | Wt 199.8 lb

## 2015-11-27 DIAGNOSIS — I4891 Unspecified atrial fibrillation: Secondary | ICD-10-CM | POA: Diagnosis not present

## 2015-11-27 NOTE — Patient Instructions (Signed)
Your physician recommends that you schedule a follow-up appointment in: 3 months with Dr. Branch  Your physician recommends that you continue on your current medications as directed. Please refer to the Current Medication list given to you today.  Thank you for choosing [facility]!!    

## 2015-11-27 NOTE — Progress Notes (Signed)
Patient ID: Veronica Harrison, female   DOB: 03-Apr-1951, 64 y.o.   MRN: [id number]     Clinical Summary Veronica Harrison is a 64 y.o.female seen today for follow up of the following medical problems. This is a focused visit on her history of afib, for more detailed medical history please refer to prior notes.   1. Afib  - we were unable to control her symptoms with up titration of AV nodal agents. She was referred to afib clinic for antiarrhythmic consideration. Started on solatol  bid in afib clinic 10/26/15. Recurrent episode on sotalol, it was increased to  bid, repeat EKG showed stable QTc around 400 - since increasing dose ot  bid she denies any significant symptoms.   Past Medical History  Diagnosis Date  . Type 2 diabetes mellitus (HCC)   . Hypertension   . Psoriatic arthritis (HCC)   . Chronic pain   . Atrial fibrillation (HCC)   . Vitamin D deficiency   . Graves' disease      Allergies  Allergen Reactions  . Penicillins Anaphylaxis  . Strawberry Extract Anaphylaxis  . Ace Inhibitors Other (See Comments)    Feeling Faint   . Pseudoephedrine Other (See Comments)    Makes heart race     Current Outpatient Prescriptions  Medication Sig Dispense Refill  . acetaminophen (TYLENOL) 500 MG tablet Take 500 mg by mouth 2 (two) times daily as needed.    Marland Kitchen albuterol (PROVENTIL) (2.5 MG/3ML) 0.083% nebulizer solution Take 2.5 mg by nebulization every 6 (six) hours as needed for wheezing or shortness of breath.    Marland Kitchen apixaban (ELIQUIS) 5 MG TABS tablet Take 1 tablet (5 mg total) by mouth 2 (two) times daily. 180 tablet 3  . bisacodyl (BISACODYL) 5 MG EC tablet Take 5 mg by mouth 2 (two) times daily as needed for moderate constipation.    . clidinium-chlordiazePOXIDE (LIBRAX) 5-2.5 MG per capsule Take 1 capsule by mouth daily.     . clindamycin (CLEOCIN) 150 MG capsule Take 600 mg by mouth as directed. Prior to dentist appointments    . clonazePAM (KLONOPIN) 2 MG tablet  Take 1 mg by mouth at bedtime.     . cyanocobalamin (,VITAMIN B-12,) 1000 MCG/ML injection Inject 1,000 mcg into the muscle every 30 (thirty) days.     Marland Kitchen diltiazem (CARDIZEM) 60 MG tablet Take 1 tablet (60 mg total) by mouth 3 (three) times daily. (Patient taking differently: Take 60 mg by mouth 3 (three) times daily. Take 2 tablets 3 times a day.) 90 tablet 3  . esomeprazole (NEXIUM) 40 MG capsule Take 40 mg by mouth daily at 12 noon.    . Fluticasone-Salmeterol (ADVAIR) 250-50 MCG/DOSE AEPB Inhale 1 puff into the lungs as needed.    . folic acid (FOLVITE) 1 MG tablet Take 1 mg by mouth 2 (two) times daily.    . furosemide (LASIX) 20 MG tablet Take one tablet once a day as needed for swelling or shortness of breath. 30 tablet 6  . glucose blood test strip 1 each by Other route as needed for other. Use as instructed    . Iron-Vitamin C (VITRON-C PO) Take 1 tablet by mouth daily.    . Lancets 28G MISC 28 g by Does not apply route daily.    . magnesium oxide (MAG-OX) 400 MG tablet Take 400 mg by mouth 2 (two) times daily.    . metFORMIN (GLUCOPHAGE) 1000 MG tablet Take 1,000 mg by mouth 2 (two) times  daily with a meal.    . Methotrexate, PF, 30 MG/0.6ML SOAJ Inject 20 mg into the skin once a week.     . metoprolol succinate (TOPROL XL) 25 MG 24 hr tablet Take 4 tablets (100 mg total) by mouth 2 (two) times daily. 240 tablet 3  . oxyCODONE (ROXICODONE) 15 MG immediate release tablet Take 15 mg by mouth as needed for pain. Take 1 1/2 tablet four times a day    . potassium chloride (KLOR-CON) 20 MEQ packet Take 20 mEq by mouth 2 (two) times daily.    . promethazine (PHENERGAN) 25 MG tablet Take 25 mg by mouth as needed for nausea or vomiting. Take 1 tablet four times a day as needed    . sotalol (BETAPACE) 120 MG tablet Take 1 tablet (120 mg total) by mouth 2 (two) times daily. 60 tablet 6  . triamcinolone (NASACORT AQ) 55 MCG/ACT AERO nasal inhaler Place 1 spray into the nose daily.    .  Tuberculin-Allergy Syringes (B-D TB SYRINGE 1CC/22GX1") 22G X 1" 1 ML MISC by Does not apply route. Use as directed    . Ustekinumab (STELARA) 90 MG/ML SOSY Inject into the skin as needed. As needed for 3 months     No current facility-administered medications for this visit.     Past Surgical History  Procedure Laterality Date  . Varicose vein surgery Left   . Cholecystectomy    . Total knee arthroplasty Right   . Abdominal hysterectomy       Allergies  Allergen Reactions  . Penicillins Anaphylaxis  . Strawberry Extract Anaphylaxis  . Ace Inhibitors Other (See Comments)    Feeling Faint   . Pseudoephedrine Other (See Comments)    Makes heart race      Family History  Problem Relation Age of Onset  . Diabetes    . CAD       Social History Veronica Harrison reports that she quit smoking about 11 years ago. Her smoking use included Cigarettes. She started smoking about 45 years ago. She has a 34 pack-year smoking history. She has never used smokeless tobacco. Veronica Harrison reports that she does not drink alcohol.   Review of Systems CONSTITUTIONAL: No weight loss, fever, chills, weakness or fatigue.  HEENT: Eyes: No visual loss, blurred vision, double vision or yellow sclerae.No hearing loss, sneezing, congestion, runny nose or sore throat.  SKIN: No rash or itching.  CARDIOVASCULAR: no chest pain, no palpitations.  RESPIRATORY: No shortness of breath, cough or sputum.  GASTROINTESTINAL: No anorexia, nausea, vomiting or diarrhea. No abdominal pain or blood.  GENITOURINARY: No burning on urination, no polyuria NEUROLOGICAL: No headache, dizziness, syncope, paralysis, ataxia, numbness or tingling in the extremities. No change in bowel or bladder control.  MUSCULOSKELETAL: No muscle, back pain, joint pain or stiffness.  LYMPHATICS: No enlarged nodes. No history of splenectomy.  PSYCHIATRIC: No history of depression or anxiety.  ENDOCRINOLOGIC: No reports of sweating, cold or  heat intolerance. No polyuria or polydipsia.  Marland Kitchen   Physical Examination Filed Vitals:   11/27/15 1306  BP: 147/70  Pulse: 62   Filed Vitals:   11/27/15 1306  Height:  (1.702 m)  Weight: 199 lb 12.8 oz (90.629 kg)    Gen: resting comfortably, no acute distress HEENT: no scleral icterus, pupils equal round and reactive, no palptable cervical adenopathy,  CV: RRR, no m/r/g, no jvd Resp: Clear to auscultation bilaterally GI: abdomen is soft, non-tender, non-distended, normal bowel sounds, no hepatosplenomegaly  MSK: extremities are warm, no edema.  Skin: warm, no rash Neuro:  no focal deficits Psych: appropriate affect   Diagnostic Studies  07/2014 Echo Study Conclusions  - Left ventricle: The cavity size was normal. Systolic function was normal. The estimated ejection fraction was in the range of 60% to 65%. Wall motion was normal; there were no regional wall motion abnormalities. Features are consistent with a pseudonormal left ventricular filling pattern, with concomitant abnormal relaxation and increased filling pressure (grade 2 diastolic dysfunction). Moderate posterior wall and mild septal hypertrophy. - Mitral valve: There was trivial regurgitation. - Left atrium: The atrium was mildly dilated.  06/2015 Holter monitor [facility]: short episodes of afib with RVR   Assessment and Plan    1. Afib - unable to control with rate control strategy - with assistance from afib clinic she has been started on sotalol. Titrated to 120mg  bid, QTc remains stable at 400, no recurrent afib symptoms since medication increase - continue current meds    Antoine PocheJonathan F. Abbagale Goguen, M.D.

## 2015-11-30 ENCOUNTER — Encounter: Payer: Self-pay | Admitting: "Endocrinology

## 2015-11-30 ENCOUNTER — Ambulatory Visit (INDEPENDENT_AMBULATORY_CARE_PROVIDER_SITE_OTHER): Payer: Medicare Other | Admitting: "Endocrinology

## 2015-11-30 VITALS — BP 167/93 | HR 72 | Ht 67.0 in | Wt 202.0 lb

## 2015-11-30 DIAGNOSIS — E0590 Thyrotoxicosis, unspecified without thyrotoxic crisis or storm: Secondary | ICD-10-CM

## 2015-11-30 NOTE — Progress Notes (Signed)
Subjective:    Patient ID: Veronica Harrison, female    DOB: 12/15/1951,    Past Medical History  Diagnosis Date  . Type 2 diabetes mellitus (HCC)   . Hypertension   . Psoriatic arthritis (HCC)   . Chronic pain   . Atrial fibrillation (HCC)   . Vitamin D deficiency   . Graves' disease    Past Surgical History  Procedure Laterality Date  . Varicose vein surgery Left   . Cholecystectomy    . Total knee arthroplasty Right   . Abdominal hysterectomy     Social History   Social History  . Marital Status: Married    Spouse Name: N/A  . Number of Children: N/A  . Years of Education: N/A   Social History Main Topics  . Smoking status: Former Smoker -- 1.00 packs/day for 34 years    Types: Cigarettes    Start date: 01/29/1970    Quit date: 09/30/2004  . Smokeless tobacco: Never Used     Comment: 10 years ago  . Alcohol Use: No  . Drug Use: No  . Sexual Activity: Not Asked   Other Topics Concern  . None   Social History Narrative   Outpatient Encounter Prescriptions as of 11/30/2015  Medication Sig  . acetaminophen (TYLENOL) 500 MG tablet Take 500 mg by mouth 2 (two) times daily as needed.  Marland Kitchen. albuterol (PROVENTIL) (2.5 MG/3ML) 0.083% nebulizer solution Take 2.5 mg by nebulization every 6 (six) hours as needed for wheezing or shortness of breath.  Marland Kitchen. apixaban (ELIQUIS) 5 MG TABS tablet Take 1 tablet (5 mg total) by mouth 2 (two) times daily.  . bisacodyl (BISACODYL) 5 MG EC tablet Take 5 mg by mouth 2 (two) times daily as needed for moderate constipation.  . clidinium-chlordiazePOXIDE (LIBRAX) 5-2.5 MG per capsule Take 1 capsule by mouth daily.   . clindamycin (CLEOCIN) 150 MG capsule Take 600 mg by mouth as directed. Prior to dentist appointments  . clonazePAM (KLONOPIN) 2 MG tablet Take 1 mg by mouth at bedtime.   . cyanocobalamin (,VITAMIN B-12,) 1000 MCG/ML injection Inject 1,000 mcg into the muscle every 30 (thirty) days.   Marland Kitchen. diltiazem (CARDIZEM) 60 MG tablet  Take 1 tablet (60 mg total) by mouth 3 (three) times daily. (Patient taking differently: Take 60 mg by mouth 3 (three) times daily. Take 2 tablets 3 times a day.)  . esomeprazole (NEXIUM) 40 MG capsule Take 40 mg by mouth daily at 12 noon.  . Fluticasone-Salmeterol (ADVAIR) 250-50 MCG/DOSE AEPB Inhale 1 puff into the lungs as needed.  . folic acid (FOLVITE) 1 MG tablet Take 1 mg by mouth 2 (two) times daily.  . furosemide (LASIX) 20 MG tablet Take one tablet once a day as needed for swelling or shortness of breath.  Marland Kitchen. glucose blood test strip 1 each by Other route as needed for other. Use as instructed  . Iron-Vitamin C (VITRON-C PO) Take 1 tablet by mouth daily.  . Lancets 28G MISC 28 g by Does not apply route daily.  . magnesium oxide (MAG-OX) 400 MG tablet Take 400 mg by mouth 2 (two) times daily.  . metFORMIN (GLUCOPHAGE) 1000 MG tablet Take 1,000 mg by mouth 2 (two) times daily with a meal.  . Methotrexate, PF, 30 MG/0.6ML SOAJ Inject 20 mg into the skin once a week.   . metoprolol succinate (TOPROL XL) 25 MG 24 hr tablet Take 4 tablets (100 mg total) by mouth 2 (two) times daily.  .Marland Kitchen  oxyCODONE (ROXICODONE) 15 MG immediate release tablet Take 15 mg by mouth as needed for pain. Take 1 1/2 tablet four times a day  . potassium chloride (KLOR-CON) 20 MEQ packet Take 20 mEq by mouth 2 (two) times daily.  . promethazine (PHENERGAN) 25 MG tablet Take 25 mg by mouth as needed for nausea or vomiting. Take 1 tablet four times a day as needed  . sotalol (BETAPACE) 120 MG tablet Take 1 tablet (120 mg total) by mouth 2 (two) times daily.  Marland Kitchen triamcinolone (NASACORT AQ) 55 MCG/ACT AERO nasal inhaler Place 1 spray into the nose daily.  . Tuberculin-Allergy Syringes (B-D TB SYRINGE 1CC/22GX1") 22G X 1" 1 ML MISC by Does not apply route. Use as directed  . Ustekinumab (STELARA) 90 MG/ML SOSY Inject into the skin as needed. As needed for 3 months   No facility-administered encounter medications on file as of  11/30/2015.   ALLERGIES: Allergies  Allergen Reactions  . Penicillins Anaphylaxis  . Strawberry Extract Anaphylaxis  . Ace Inhibitors Other (See Comments)    Feeling Faint   . Pseudoephedrine Other (See Comments)    Makes heart race   VACCINATION STATUS:  There is no immunization history on file for this patient.  HPI  The patient presents today to follow-up on her history of hyperthyroidism. The patient has been dealing with symptoms of palpitations, tremors, and fatigue for approximately 6 months prior to April 2016 at which time she was diagnosed with Graves' disease and treated with RAI thyroid ablation.  Since her last visit, I received some records from [facility]. Per her records, she was diagnosed with Graves' disease in April 2016 based on thyroid uptake and scan which showed uptake of 40.5% uniform uptake. Her records also shows that she was given 10 mCi of I-131 on 10/08/2015 for ablative therapy of Graves' disease.  -She has observed symptomatic improvement since therapy, she was not initiated on thyroid hormone.  -On her last visit with me, she was sent to lab for thyroid function tests and on 11/05/2015 her TSH was still suppressed at less than 0.008, free T4 still elevated at 3.5 to, and free T3 elevated at 10.1 .    The patient has family history of thyroid dysfunction with various types in 3 sisters. - the patient denies personal history of goiter. Patient is not on any anti-thyroid medications nor on any thyroid hormone supplements currently. Patient  is willing to proceed with appropriate work up and therapy for thyrotoxicosis.   Review of Systems  Constitutional: + weight gain, no fatigue, no subjective hyperthermia/hypothermia Eyes: no blurry vision, no xerophthalmia ENT: no sore throat, no nodules palpated in throat, no dysphagia/odynophagia, no hoarseness Cardiovascular: no CP/SOB/palpitations/leg swelling Respiratory: no  cough/SOB Gastrointestinal: no N/V/D/C Musculoskeletal: no muscle/ +joint aches Skin: no rashes Neurological: no tremors/numbness/tingling/dizziness Psychiatric: no depression/anxiety   Objective:    BP 167/93 mmHg  Pulse 72  Ht  (1.702 m)  Wt 202 lb (91.627 kg)  BMI 31.63 kg/m2  SpO2 95%  Wt Readings from Last 3 Encounters:  11/30/15 202 lb (91.627 kg)  11/27/15 199 lb 12.8 oz (90.629 kg)  11/16/15 198 lb (89.812 kg)    Physical Exam  Constitutional: overweight, in NAD Eyes: PERRLA, EOMI, no exophthalmos ENT: moist mucous membranes, no thyromegaly, no cervical lymphadenopathy Cardiovascular: RRR, No MRG Respiratory: CTA B Gastrointestinal: abdomen soft, NT, ND, BS+ Musculoskeletal: she has surgical scar on right knee, moderately tender , swollen left knee. strength intact in all  4 Skin: moist, warm, no rashes Neurological: no tremor with outstretched hands, DTR normal in all 4  Results for orders placed or performed in visit on 11/15/15  T4, free  Result Value Ref Range   Free T4 3.52 (H) 0.80 - 1.80 ng/dL  TSH  Result Value Ref Range   TSH <0.008 (L) 0.350 - 4.500 uIU/mL  T3, free  Result Value Ref Range   T3, Free 10.1 (H) 2.3 - 4.2 pg/mL   Complete Blood Count (Most recent): No results found for: WBC, HGB, HCT, MCV, PLT Chemistry (most recent): Lab Results  Component Value Date   NA 135 10/30/2015   K 4.1 10/30/2015   CL 99* 10/30/2015   CO2 29 10/30/2015   BUN <5* 10/30/2015   CREATININE 0.57 10/30/2015     Assessment & Plan:   1. Overactive thyroid gland  Patient's history and most recent labs are reviewed. Findings are consistent with history of hyperthyroidism from Graves' disease status post therapy with RAI- on 10/08/2015. -Her most recent labs were done too soon, after her RAI. -She is probably still recovering from thyrotoxicosis from Graves' disease versus treatment failure. -I discussed the plan with her to repeat thyroid function  test 7 weeks from now. In the meantime she would not need thyroid hormone initiation nor antithyroid hormone therapy. -If thyroid function tests indicate persistent hyperthyroidism she will be considered for repeat therapy with RAI. This is particularly important given her history of atrial fibrillation.  The patient will return in 8 weeks for treatment decision. -She has several other medical conditions including controlled type 2 diabetes I have advised her to continue close follow-up with her primary medical doctor.  Follow up plan: Return in about 8 weeks (around 01/25/2016) for overactive thyroid, follow up with pre-visit labs.  Marquis Lunch, MD Phone: [phone]  Fax: [phone]   11/30/2015, 3:25 PM

## 2015-12-05 ENCOUNTER — Other Ambulatory Visit: Payer: Self-pay | Admitting: Cardiology

## 2015-12-05 MED ORDER — APIXABAN 5 MG PO TABS: 5.0000 mg | ORAL_TABLET | Freq: Two times a day (BID) | ORAL | Status: DC

## 2015-12-18 ENCOUNTER — Telehealth: Payer: Self-pay | Admitting: *Deleted

## 2015-12-18 NOTE — Telephone Encounter (Signed)
Pt made aware is requesting some better guidelines for when to hold diltiazem with HR below 60, pt has been taking medications as directed but HR is running 50-60 with BP at night running 160s-70s. Pt though she had been told to hold off ontaking metoprolol when HR dropped below 60 and hold diltiazem when below 50, but pt is unclear on this and took 50 mg of metoprolol this AM and BP was 164/71 HR 60. Will also forward this note to Dr. Burna MortimerIsernia per pt request for headaches.

## 2015-12-18 NOTE — Telephone Encounter (Signed)
Unlikely that her headaches are coming from sotalol. She has been on the medicine nearly 2 months now, and only a small percentage of people get headaches from sotaolol. Given how difficult her afib was to control and how well she is doing on the medicine I would want to make sure that all other possible causes of headache were ruled out before we start changing it around. She needs a workup with her primary care doctor and potentially a neurologist, if that is negative then we can consider changing meds but due to previous treatment failure and potential costs for her this would be very tricky.   Dominga FerryJ Daysie Helf MD

## 2015-12-18 NOTE — Telephone Encounter (Signed)
Since her heart rhythm is staying normal with the sotaolol she likely needs lower doses of her dilt and Toprol. Out of a 7 day week how many days a week is she taking the diltiazem as prescribed? How many days is she taking the Toprol as prescribed? Please verify the doses of each she is taking   J Gethsemane Fischler MD

## 2015-12-18 NOTE — Telephone Encounter (Signed)
Pt says for the last 3 weeks having migraine headaches with soreness around the L eye with soreness behind the eye, not constant but continuing to happen almost daily. Last nights headache was the worst she has had. She would like to know if headaches could be coming from the heart meds. Pt knows that this is not a heart issue, but pcp suggested it possibly could be coming from heart meds and reccommended she call us. Pt says she has enough drs and if possible try to avoid going to neurology. Will forward to Dr. Wyline MoodBranch

## 2015-12-19 NOTE — Telephone Encounter (Signed)
Pt says she is taking metoprolol and diltiazem as directed 5-6 days a week. She has only decreased them about 2 times over the last 3 weeks due to HR dropping in 50s and headache. Metoprolol is 100 mg bid and diltiazem is 60 mg tid. When she decreases metoprolol she takes 50 mg bid and says she was told to hold diltiazem only when HR below 60, and she was unclear how long to hold diltiazem, so pt only held morning dose of dilt.

## 2015-12-19 NOTE — Telephone Encounter (Signed)
Will forward to DOD with Dr. Wyline MoodBranch out of office

## 2015-12-19 NOTE — Telephone Encounter (Signed)
Your pt

## 2015-12-20 NOTE — Telephone Encounter (Signed)
Lets have her decrease her dilt to 60mg  bid. I would continue her metoprolol 100mg  bid, if her heart rate is <60 I would only take 50mg  for that dose. If later in the day at the time of her second dose I would base the dose on her heart rate.  She should hold her dilt if her heart rate is <60. Its ok to take her later dilt dose if heart rate is above 60 later in the day. As her afib continues to stay quite we will simplify things in the near future. She likely will need another agent for her bp in the near future as well as her heart rates seem fairly well controlled but blood pressures remain elevated. Please have her keep track of her heart rates and bp's x 7 days with these changes and then we will make changes based on that.    Dominga FerryJ Sharleen Szczesny MD

## 2015-12-20 NOTE — Telephone Encounter (Signed)
Verbally gave pt instructions, had pt read this back to me. Pt verbalized understanding and will update us in 1 week with BP and HR.

## 2015-12-28 NOTE — Telephone Encounter (Signed)
F/u with pt on BP HR and headache. Pt reports she has had hardly any headaches since last week. BP and HR are remaining WNL. HR staying in 60s. Pt appreciative of call and help with medications.

## 2016-01-11 ENCOUNTER — Telehealth: Payer: Self-pay | Admitting: Cardiology

## 2016-01-11 NOTE — Telephone Encounter (Signed)
Pulse staying in the 50's   Only symptom is headache  Also, [facility].   Told her they did not have all information needed

## 2016-01-11 NOTE — Telephone Encounter (Signed)
Please advise if patient can decrease her toprol xl to help with heart rate.

## 2016-01-14 NOTE — Telephone Encounter (Signed)
I would still try her on Toprol XL 75mg  bid.Please have Staci look into the assistance and if there is more information they need  Dominga FerryJ Nataya Bastedo MD

## 2016-01-14 NOTE — Telephone Encounter (Signed)
Spoke with patient and she confirmed her that she takes 100 mg twice daily and 50 mg if her heart rate is below 60. Patient said that her heart rate has come up now and is ranging from 58-high 60's. Geanie BerlinStaci will be notified about [facility] [facility] patient assistance for eliquis.

## 2016-01-14 NOTE — Telephone Encounter (Signed)
Ok to decrease Toprol XL. Verify she is taking 100mg  bid, ok to decrease to 75mg  bid.What request did [facility] deny?   Dominga FerryJ Maelys Kinnick MD

## 2016-01-14 NOTE — Telephone Encounter (Signed)
Patient informed and verbalized understanding of plan. [facility] from [facility].

## 2016-01-16 NOTE — Telephone Encounter (Signed)
Pt verified dilt

## 2016-01-16 NOTE — Telephone Encounter (Signed)
60 mg bid when Hr is >60. Pt will monitor HR and hold Toprol and call us back later this week with HR.

## 2016-01-16 NOTE — Telephone Encounter (Signed)
Pt says HR yesterday

## 2016-01-16 NOTE — Telephone Encounter (Signed)
If heart rates remain that low would hold the Toprol for now, she is still taking her diltiazem? Please confirm the dose she is taking. Her EKG had been stable for the last several checks so does not need monthly ekgs at this time, just at follow up. Have her continue to update [place] on her heart rates throughout the week   Dominga Ferry MD

## 2016-01-16 NOTE — Telephone Encounter (Signed)
HR remained in the 40s BP 132/56 146/49 115/56 157/57 143/64. HR today  Remaining in 50s. Pt only took 1 dose 50 mg metoprolol yesterday, instead of 75 mg bid per 12/20/15 telephone note (<60 HR take 50 mg of metoprolol). Pt says yesterday she fell yesterday and was concerned it might be because of HR in 40s yesterday. Pt denied being dizzy or CP. She also think that she was told to have EKG monthly. Will forward to Dr. Wyline Mood

## 2016-01-21 ENCOUNTER — Other Ambulatory Visit: Payer: Self-pay | Admitting: "Endocrinology

## 2016-01-21 LAB — TSH: TSH: 44.002 u[IU]/mL — ABNORMAL HIGH (ref 0.350–4.500)

## 2016-01-21 LAB — T4, FREE: FREE T4: 0.4 ng/dL — AB (ref 0.80–1.80)

## 2016-01-25 ENCOUNTER — Other Ambulatory Visit: Payer: Self-pay | Admitting: Cardiology

## 2016-01-28 ENCOUNTER — Telehealth: Payer: Self-pay

## 2016-01-28 ENCOUNTER — Encounter: Payer: Self-pay | Admitting: "Endocrinology

## 2016-01-28 ENCOUNTER — Ambulatory Visit (INDEPENDENT_AMBULATORY_CARE_PROVIDER_SITE_OTHER): Payer: Medicare Other | Admitting: "Endocrinology

## 2016-01-28 VITALS — BP 141/71 | HR 58 | Ht 67.0 in | Wt 202.0 lb

## 2016-01-28 DIAGNOSIS — E032 Hypothyroidism due to medicaments and other exogenous substances: Secondary | ICD-10-CM | POA: Diagnosis not present

## 2016-01-28 MED ORDER — SYNTHROID 100 MCG PO TABS: 100.0000 ug | ORAL_TABLET | Freq: Every day | ORAL | Status: DC

## 2016-01-28 MED ORDER — LEVOTHYROXINE SODIUM 112 MCG PO TABS: 112.0000 ug | ORAL_TABLET | Freq: Every day | ORAL | Status: DC

## 2016-01-28 NOTE — Telephone Encounter (Signed)
100 g is a right dose.

## 2016-01-28 NOTE — Telephone Encounter (Signed)
pts pharmacy is questioning if the synthroid needs to be .?

## 2016-01-28 NOTE — Progress Notes (Signed)
Subjective:    Patient ID: Veronica Harrison, female    DOB: 03-12-1951,    Past Medical History  Diagnosis Date  . Type 2 diabetes mellitus (HCC)   . Hypertension   . Psoriatic arthritis (HCC)   . Chronic pain   . Atrial fibrillation (HCC)   . Vitamin D deficiency   . Graves' disease    Past Surgical History  Procedure Laterality Date  . Varicose vein surgery Left   . Cholecystectomy    . Total knee arthroplasty Right   . Abdominal hysterectomy     Social History   Social History  . Marital Status: Married    Spouse Name: N/A  . Number of Children: N/A  . Years of Education: N/A   Social History Main Topics  . Smoking status: Former Smoker -- 1.00 packs/day for 34 years    Types: Cigarettes    Start date: 01/29/1970    Quit date: 09/30/2004  . Smokeless tobacco: Never Used     Comment: 10 years ago  . Alcohol Use: No  . Drug Use: No  . Sexual Activity: Not Asked   Other Topics Concern  . None   Social History Narrative   Outpatient Encounter Prescriptions as of 01/28/2016  Medication Sig  . acetaminophen (TYLENOL) 500 MG tablet Take 500 mg by mouth 2 (two) times daily as needed.  Marland Kitchen albuterol (PROVENTIL) (2.5 MG/3ML) 0.083% nebulizer solution Take 2.5 mg by nebulization every 6 (six) hours as needed for wheezing or shortness of breath.  Marland Kitchen apixaban (ELIQUIS) 5 MG TABS tablet Take 1 tablet (5 mg total) by mouth 2 (two) times daily.  . bisacodyl (BISACODYL) 5 MG EC tablet Take 5 mg by mouth 2 (two) times daily as needed for moderate constipation.  . clidinium-chlordiazePOXIDE (LIBRAX) 5-2.5 MG per capsule Take 1 capsule by mouth daily.   . clindamycin (CLEOCIN) 150 MG capsule Take 600 mg by mouth as directed. Prior to dentist appointments  . clonazePAM (KLONOPIN) 2 MG tablet Take 1 mg by mouth at bedtime.   . cyanocobalamin (,VITAMIN B-12,) 1000 MCG/ML injection Inject 1,000 mcg into the muscle every 30 (thirty) days.   Marland Kitchen diltiazem (CARDIZEM) 60 MG tablet  Take 60 mg by mouth 2 (two) times daily.  Marland Kitchen esomeprazole (NEXIUM) 40 MG capsule Take 40 mg by mouth daily at 12 noon.  . Fluticasone-Salmeterol (ADVAIR) 250-50 MCG/DOSE AEPB Inhale 1 puff into the lungs as needed.  . folic acid (FOLVITE) 1 MG tablet Take 1 mg by mouth 2 (two) times daily.  . furosemide (LASIX) 20 MG tablet Take one tablet once a day as needed for swelling or shortness of breath.  Marland Kitchen glucose blood test strip 1 each by Other route as needed for other. Use as instructed  . Iron-Vitamin C (VITRON-C PO) Take 1 tablet by mouth daily.  . Lancets 28G MISC 28 g by Does not apply route daily.  . magnesium oxide (MAG-OX) 400 MG tablet Take 400 mg by mouth 2 (two) times daily.  . metFORMIN (GLUCOPHAGE) 1000 MG tablet Take 1,000 mg by mouth 2 (two) times daily with a meal.  . Methotrexate, PF, 30 MG/0.6ML SOAJ Inject 20 mg into the skin once a week.   . metoprolol succinate (TOPROL-XL) 25 MG 24 hr tablet TAKE 4 TABLETS TWICE DAILY.  Marland Kitchen oxyCODONE (ROXICODONE) 15 MG immediate release tablet Take 15 mg by mouth as needed for pain. Take 1 1/2 tablet four times a day  . potassium chloride (  KLOR-CON) 20 MEQ packet Take 20 mEq by mouth 2 (two) times daily.  . promethazine (PHENERGAN) 25 MG tablet Take 25 mg by mouth as needed for nausea or vomiting. Take 1 tablet four times a day as needed  . sotalol (BETAPACE) 120 MG tablet Take 1 tablet (120 mg total) by mouth 2 (two) times daily.  Marland Kitchen SYNTHROID 100 MCG tablet Take 1 tablet (100 mcg total) by mouth daily before breakfast.  . triamcinolone (NASACORT AQ) 55 MCG/ACT AERO nasal inhaler Place 1 spray into the nose daily.  . Tuberculin-Allergy Syringes (B-D TB SYRINGE 1CC/22GX1") 22G X 1" 1 ML MISC by Does not apply route. Use as directed  . Ustekinumab (STELARA) 90 MG/ML SOSY Inject into the skin as needed. As needed for 3 months  . [DISCONTINUED] levothyroxine (SYNTHROID, LEVOTHROID) 112 MCG tablet Take 1 tablet (112 mcg total) by mouth daily.   No  facility-administered encounter medications on file as of 01/28/2016.   ALLERGIES: Allergies  Allergen Reactions  . Penicillins Anaphylaxis  . Strawberry Extract Anaphylaxis  . Ace Inhibitors Other (See Comments)    Feeling Faint   . Pseudoephedrine Other (See Comments)    Makes heart race   VACCINATION STATUS:  There is no immunization history on file for this patient.  HPI  The patient presents today to follow-up on her history of hyperthyroidism.  - she is status post RAI  Ablative therapy for Graves' disease in April 2016. - she continues to improve in her symptoms now denies palpitations, tremors, and  Hot flashes.   The patient has family history of thyroid dysfunction with various types in 3 sisters. - the patient denies personal history of goiter. Patient is not on any anti-thyroid medications nor on any thyroid hormone supplements currently. Patient  is willing to proceed with appropriate work up and therapy for thyrotoxicosis.   Review of Systems  Constitutional:  She has a steady weight , + fatigue  No subjective hyperthermia/hypothermia Eyes: no blurry vision, no xerophthalmia ENT: no sore throat, no nodules palpated in throat, no dysphagia/odynophagia, no hoarseness Cardiovascular: no CP/SOB/palpitations/leg swelling Respiratory: no cough/SOB Gastrointestinal: no N/V/D/C Musculoskeletal: no muscle/ +joint aches Skin: no rashes Neurological: no tremors/numbness/tingling/dizziness Psychiatric: no depression/anxiety   Objective:    BP 141/71 mmHg  Pulse 58  Ht  (1.702 m)  Wt 202 lb (91.627 kg)  BMI 31.63 kg/m2  SpO2 95%  Wt Readings from Last 3 Encounters:  01/28/16 202 lb (91.627 kg)  11/30/15 202 lb (91.627 kg)  11/27/15 199 lb 12.8 oz (90.629 kg)    Physical Exam  Constitutional: overweight, in NAD Eyes: PERRLA, EOMI, no exophthalmos ENT: moist mucous membranes, no thyromegaly, no cervical lymphadenopathy Cardiovascular: RRR, No  MRG Respiratory: CTA B Gastrointestinal: abdomen soft, NT, ND, BS+ Musculoskeletal: she has surgical scar on right knee, moderately tender , swollen left knee. strength intact in all 4 Skin: moist, warm, no rashes Neurological: no tremor with outstretched hands, DTR normal in all 4  Results for orders placed or performed in visit on 01/21/16  TSH  Result Value Ref Range   TSH 44.002 (H) 0.350 - 4.500 uIU/mL  T4, free  Result Value Ref Range   Free T4 0.40 (L) 0.80 - 1.80 ng/dL   Complete Blood Count (Most recent): No results found for: WBC, HGB, HCT, MCV, PLT Chemistry (most recent): Lab Results  Component Value Date   NA 135 10/30/2015   K 4.1 10/30/2015   CL 99* 10/30/2015   CO2 29  10/30/2015   BUN <5* 10/30/2015   CREATININE 0.57 10/30/2015     Assessment & Plan:   1. RAI induced hypothyroidism: - her labs are consistent with treatment effect after RAI ablative therapy for Graves' disease. - She will be initiated on thyroid hormone replacement.   we discussed her options , she preferred brand Synthroid over levothyroxine due to her history of anaphylaxis. - I will start Synthroid 100 g by mouth every morning.    - We discussed about correct intake of levothyroxine, at fasting, with water, separated by at least 30 minutes from breakfast, and separated by more than 4 hours from calcium, iron, multivitamins, acid reflux medications (PPIs). -Patient is made aware of the fact that thyroid hormone replacement is needed for life, dose to be adjusted by periodic monitoring of thyroid function tests.  The patient will return in 10 weeks  With TFTS to adjust her doses. -She has several other medical conditions including controlled type 2 diabetes I have advised her to continue close follow-up with her primary medical doctor.  Follow up plan: Return in about 10 weeks (around 04/07/2016) for follow up with pre-visit labs.  Marquis Lunch, MD Phone: [phone]  Fax: [phone]    01/28/2016, 11:50 AM

## 2016-01-29 ENCOUNTER — Ambulatory Visit (INDEPENDENT_AMBULATORY_CARE_PROVIDER_SITE_OTHER): Payer: Medicare PPO | Admitting: Cardiology

## 2016-01-29 ENCOUNTER — Encounter: Payer: Self-pay | Admitting: Cardiology

## 2016-01-29 VITALS — BP 152/72 | HR 55 | Ht 67.0 in | Wt 209.0 lb

## 2016-01-29 DIAGNOSIS — Z79899 Other long term (current) drug therapy: Secondary | ICD-10-CM | POA: Diagnosis not present

## 2016-01-29 DIAGNOSIS — I4891 Unspecified atrial fibrillation: Secondary | ICD-10-CM

## 2016-01-29 DIAGNOSIS — Z5181 Encounter for therapeutic drug level monitoring: Secondary | ICD-10-CM

## 2016-01-29 DIAGNOSIS — I10 Essential (primary) hypertension: Secondary | ICD-10-CM | POA: Diagnosis not present

## 2016-01-29 NOTE — Patient Instructions (Addendum)
   Stop the Toprol XL. Continue all other medications.   Your physician has requested that you regularly monitor and record your blood pressure readings at home. Please use the same machine at the same time of day to check your readings and record them to bring to your follow-up visit.  Keep log x 2 weeks and return to office for MD review.   Follow up in  3 months.

## 2016-01-29 NOTE — Progress Notes (Signed)
Patient ID: Veronica Harrison, female   DOB: [birth year], 65 y.o.   MRN: [id number]     Clinical Summary Ms. Mell is a 65 y.o.female seen today for follow up of the following medical problems.   1. Afib  - failed rate control strategy. Started on sotalol in EP clinic, with titration to  bid she has done very well. QTc has remained normal on routine checks.  - no recent palpitations - notes some low heart rates at times, she remains on dilt and Toprol.  - she is on eliquis for stroke prevention   Past Medical History  Diagnosis Date  . Type 2 diabetes mellitus (HCC)   . Hypertension   . Psoriatic arthritis (HCC)   . Chronic pain   . Atrial fibrillation (HCC)   . Vitamin D deficiency   . Graves' disease      Allergies  Allergen Reactions  . Penicillins Anaphylaxis  . Strawberry Extract Anaphylaxis  . Ace Inhibitors Other (See Comments)    Feeling Faint   . Pseudoephedrine Other (See Comments)    Makes heart race     Current Outpatient Prescriptions  Medication Sig Dispense Refill  . acetaminophen (TYLENOL) 500 MG tablet Take 500 mg by mouth 2 (two) times daily as needed.    Marland Kitchen albuterol (PROVENTIL) (2.5 MG/3ML) 0.083% nebulizer solution Take 2.5 mg by nebulization every 6 (six) hours as needed for wheezing or shortness of breath.    Marland Kitchen apixaban (ELIQUIS) 5 MG TABS tablet Take 1 tablet (5 mg total) by mouth 2 (two) times daily. 180 tablet 3  . bisacodyl (BISACODYL) 5 MG EC tablet Take 5 mg by mouth 2 (two) times daily as needed for moderate constipation.    . clidinium-chlordiazePOXIDE (LIBRAX) 5-2.5 MG per capsule Take 1 capsule by mouth daily.     . clindamycin (CLEOCIN) 150 MG capsule Take 600 mg by mouth as directed. Prior to dentist appointments    . clonazePAM (KLONOPIN) 2 MG tablet Take 1 mg by mouth at bedtime.     . cyanocobalamin (,VITAMIN B-12,) 1000 MCG/ML injection Inject 1,000 mcg into the muscle every 30 (thirty) days.     Marland Kitchen diltiazem (CARDIZEM) 60 MG  tablet Take 60 mg by mouth 2 (two) times daily.    Marland Kitchen esomeprazole (NEXIUM) 40 MG capsule Take 40 mg by mouth daily at 12 noon.    . Fluticasone-Salmeterol (ADVAIR) 250-50 MCG/DOSE AEPB Inhale 1 puff into the lungs as needed.    . folic acid (FOLVITE) 1 MG tablet Take 1 mg by mouth 2 (two) times daily.    . furosemide (LASIX) 20 MG tablet Take one tablet once a day as needed for swelling or shortness of breath. 30 tablet 6  . glucose blood test strip 1 each by Other route as needed for other. Use as instructed    . Iron-Vitamin C (VITRON-C PO) Take 1 tablet by mouth daily.    . Lancets 28G MISC 28 g by Does not apply route daily.    . magnesium oxide (MAG-OX) 400 MG tablet Take 400 mg by mouth 2 (two) times daily.    . metFORMIN (GLUCOPHAGE) 1000 MG tablet Take 1,000 mg by mouth 2 (two) times daily with a meal.    . Methotrexate, PF, 30 MG/0.6ML SOAJ Inject 20 mg into the skin once a week.     . metoprolol succinate (TOPROL-XL) 25 MG 24 hr tablet TAKE 4 TABLETS TWICE DAILY. 240 tablet 1  . oxyCODONE (ROXICODONE) 15 MG  immediate release tablet Take 15 mg by mouth as needed for pain. Take 1 1/2 tablet four times a day    . potassium chloride (KLOR-CON) 20 MEQ packet Take 20 mEq by mouth 2 (two) times daily.    . promethazine (PHENERGAN) 25 MG tablet Take 25 mg by mouth as needed for nausea or vomiting. Take 1 tablet four times a day as needed    . sotalol (BETAPACE) 120 MG tablet Take 1 tablet (120 mg total) by mouth 2 (two) times daily. 60 tablet 6  . SYNTHROID 100 MCG tablet Take 1 tablet (100 mcg total) by mouth daily before breakfast. 30 tablet 2  . triamcinolone (NASACORT AQ) 55 MCG/ACT AERO nasal inhaler Place 1 spray into the nose daily.    . Tuberculin-Allergy Syringes (B-D TB SYRINGE 1CC/22GX1") 22G X 1" 1 ML MISC by Does not apply route. Use as directed    . Ustekinumab (STELARA) 90 MG/ML SOSY Inject into the skin as needed. As needed for 3 months     No current facility-administered  medications for this visit.     Past Surgical History  Procedure Laterality Date  . Varicose vein surgery Left   . Cholecystectomy    . Total knee arthroplasty Right   . Abdominal hysterectomy       Allergies  Allergen Reactions  . Penicillins Anaphylaxis  . Strawberry Extract Anaphylaxis  . Ace Inhibitors Other (See Comments)    Feeling Faint   . Pseudoephedrine Other (See Comments)    Makes heart race      Family History  Problem Relation Age of Onset  . Diabetes    . CAD       Social History Ms. Mauney reports that she quit smoking about 11 years ago. Her smoking use included Cigarettes. She started smoking about 46 years ago. She has a 34 pack-year smoking history. She has never used smokeless tobacco. Ms. Straley reports that she does not drink alcohol.   Review of Systems CONSTITUTIONAL: No weight loss, fever, chills, weakness or fatigue.  HEENT: Eyes: No visual loss, blurred vision, double vision or yellow sclerae.No hearing loss, sneezing, congestion, runny nose or sore throat.  SKIN: No rash or itching.  CARDIOVASCULAR: per HPI RESPIRATORY: No shortness of breath, cough or sputum.  GASTROINTESTINAL: No anorexia, nausea, vomiting or diarrhea. No abdominal pain or blood.  GENITOURINARY: No burning on urination, no polyuria NEUROLOGICAL: No headache, dizziness, syncope, paralysis, ataxia, numbness or tingling in the extremities. No change in bowel or bladder control.  MUSCULOSKELETAL: No muscle, back pain, joint pain or stiffness.  LYMPHATICS: No enlarged nodes. No history of splenectomy.  PSYCHIATRIC: No history of depression or anxiety.  ENDOCRINOLOGIC: No reports of sweating, cold or heat intolerance. No polyuria or polydipsia.  Marland Kitchen   Physical Examination Filed Vitals:   01/29/16 0820  BP: 152/72  Pulse: 55   Filed Vitals:   01/29/16 0820  Height:  (1.702 m)  Weight: 209 lb (94.802 kg)    Gen: resting comfortably, no acute distress HEENT:  no scleral icterus, pupils equal round and reactive, no palptable cervical adenopathy,  CV: RRR, no m/r/g, no jvd Resp: Clear to auscultation bilaterally GI: abdomen is soft, non-tender, non-distended, normal bowel sounds, no hepatosplenomegaly MSK: extremities are warm, no edema.  Skin: warm, no rash Neuro:  no focal deficits Psych: appropriate affect   Diagnostic Studies 07/2014 Echo Study Conclusions  - Left ventricle: The cavity size was normal. Systolic function was normal. The estimated ejection  fraction was in the range of 60% to 65%. Wall motion was normal; there were no regional wall motion abnormalities. Features are consistent with a pseudonormal left ventricular filling pattern, with concomitant abnormal relaxation and increased filling pressure (grade 2 diastolic dysfunction). Moderate posterior wall and mild septal hypertrophy. - Mitral valve: There was trivial regurgitation. - Left atrium: The atrium was mildly dilated.  06/2015 Holter monitor [facility]: short episodes of afib with RVR   01/29/16 Clinic EKG (performed and reviewed in clinic): normal sinus rhythm, normal QTc.    Assessment and Plan   1. Afib - maintaining sinus rhythm on sotalol, no recent symptoms. EKG in clinic today reviewed and show sinus rhythm with normal QTc - notes some low heart rates, we will d/c her Toprol and continue her dilt. Low rates likely due to fact she is staying in sinus rhythm and recent issues with hyperthyroidism are resolved, does not require dual AV nodal agents anymore - continue eliquis for stroke prevention  2. HTN - she will give [place] a bp log in 2 weeks. May need alternative agent for bp since coming off Toprol. Would likely consider a diuretic.         Antoine Poche, M.D.

## 2016-01-30 NOTE — Telephone Encounter (Signed)
[facility] pharmacy notified.

## 2016-02-01 ENCOUNTER — Other Ambulatory Visit: Payer: Self-pay | Admitting: Cardiology

## 2016-02-20 ENCOUNTER — Ambulatory Visit: Payer: Medicare Other | Admitting: Cardiology

## 2016-02-21 ENCOUNTER — Telehealth: Payer: Self-pay | Admitting: "Endocrinology

## 2016-02-21 ENCOUNTER — Telehealth: Payer: Self-pay | Admitting: *Deleted

## 2016-02-21 NOTE — Telephone Encounter (Signed)
-----   Message from Antoine Poche, MD sent at 02/20/2016  1:54 PM EST ----- BP log reviewed, overall bp's and pulse rates look ok. COntinue current meds   Dominga Ferry MD

## 2016-02-21 NOTE — Telephone Encounter (Signed)
Patient c/o elevated pulse rate and waking up feeling flushed and hot. She feels like this is a result of her thyroid medication. Please advise.

## 2016-02-21 NOTE — Telephone Encounter (Signed)
Patient informed. 

## 2016-02-25 ENCOUNTER — Other Ambulatory Visit: Payer: Self-pay | Admitting: "Endocrinology

## 2016-02-25 MED ORDER — LEVOTHYROXINE SODIUM 88 MCG PO TABS: 88.0000 ug | ORAL_TABLET | Freq: Every day | ORAL | Status: DC

## 2016-02-25 NOTE — Telephone Encounter (Signed)
It is possible that her thyroid hormone causing this, we can cut back to 88 g. I will send a new prescription. She can set aside 112 g pills for now.

## 2016-02-29 ENCOUNTER — Other Ambulatory Visit: Payer: Self-pay | Admitting: Orthopedic Surgery

## 2016-02-29 DIAGNOSIS — M25512 Pain in left shoulder: Secondary | ICD-10-CM

## 2016-03-03 ENCOUNTER — Telehealth: Payer: Self-pay | Admitting: *Deleted

## 2016-03-03 NOTE — Telephone Encounter (Signed)
Pt voiced understanding of holding eliquis.

## 2016-03-03 NOTE — Telephone Encounter (Signed)
Ok to have shoulder injection. Would need to hold eliquis starting 2 days before and restart 1 day after.   Dominga FerryJ Isella Slatten MD

## 2016-03-03 NOTE — Telephone Encounter (Signed)
Patients orthopedic dr would like to give her steroid and lidocaine injection for shoulder, pt would like to know if this is ok from cardiac perspective. Will forward to Dr. Wyline MoodBranch

## 2016-03-05 ENCOUNTER — Other Ambulatory Visit: Payer: Self-pay | Admitting: Cardiology

## 2016-03-09 ENCOUNTER — Other Ambulatory Visit: Payer: Medicare PPO

## 2016-03-13 ENCOUNTER — Telehealth: Payer: Self-pay

## 2016-03-13 NOTE — Telephone Encounter (Signed)
Pt called stating that she doesn't understand why her Levothyroxine was changed to 88 MCG. i reminded pt of the phone call she made to our office on 02-21-16. She sates that she is still dizzy and fatigued. I told her that its probably not the medication causing the symptoms. I advised her to see her PCP.

## 2016-03-26 ENCOUNTER — Ambulatory Visit
Admission: RE | Admit: 2016-03-26 | Discharge: 2016-03-26 | Disposition: A | Discharge status: Home / Self Care | Payer: Medicare PPO | Source: Ambulatory Visit | Attending: Orthopedic Surgery | Admitting: Orthopedic Surgery

## 2016-03-26 DIAGNOSIS — M25512 Pain in left shoulder: Secondary | ICD-10-CM

## 2016-03-31 ENCOUNTER — Other Ambulatory Visit: Payer: Self-pay | Admitting: "Endocrinology

## 2016-04-01 LAB — TSH: TSH: 7.23 mIU/L — ABNORMAL HIGH

## 2016-04-01 LAB — T4, FREE: Free T4: 1.4 ng/dL (ref 0.8–1.8)

## 2016-04-07 ENCOUNTER — Ambulatory Visit (INDEPENDENT_AMBULATORY_CARE_PROVIDER_SITE_OTHER): Payer: Medicare PPO | Admitting: "Endocrinology

## 2016-04-07 ENCOUNTER — Encounter: Payer: Self-pay | Admitting: "Endocrinology

## 2016-04-07 VITALS — BP 142/95 | HR 66 | Ht 67.0 in | Wt 206.0 lb

## 2016-04-07 DIAGNOSIS — E032 Hypothyroidism due to medicaments and other exogenous substances: Secondary | ICD-10-CM | POA: Diagnosis not present

## 2016-04-07 MED ORDER — LEVOTHYROXINE SODIUM 112 MCG PO TABS: 112.0000 ug | ORAL_TABLET | Freq: Every day | ORAL | Status: DC

## 2016-04-07 NOTE — Progress Notes (Signed)
Subjective:    Patient ID: Veronica Harrison, female    DOB: Feb 21, 1951,    Past Medical History  Diagnosis Date  . Type 2 diabetes mellitus (HCC)   . Hypertension   . Psoriatic arthritis (HCC)   . Chronic pain   . Atrial fibrillation (HCC)   . Vitamin D deficiency   . Graves' disease    Past Surgical History  Procedure Laterality Date  . Varicose vein surgery Left   . Cholecystectomy    . Total knee arthroplasty Right   . Abdominal hysterectomy     Social History   Social History  . Marital Status: Married    Spouse Name: N/A  . Number of Children: N/A  . Years of Education: N/A   Social History Main Topics  . Smoking status: Former Smoker -- 1.00 packs/day for 34 years    Types: Cigarettes    Start date: 01/29/1970    Quit date: 09/30/2004  . Smokeless tobacco: Never Used     Comment: 10 years ago  . Alcohol Use: No  . Drug Use: No  . Sexual Activity: Not Asked   Other Topics Concern  . None   Social History Narrative   Outpatient Encounter Prescriptions as of 04/07/2016  Medication Sig  . acetaminophen (TYLENOL) 500 MG tablet Take 500 mg by mouth 2 (two) times daily as needed.  Marland Kitchen albuterol (PROVENTIL) (2.5 MG/3ML) 0.083% nebulizer solution Take 2.5 mg by nebulization every 6 (six) hours as needed for wheezing or shortness of breath.  Marland Kitchen apixaban (ELIQUIS) 5 MG TABS tablet Take 1 tablet (5 mg total) by mouth 2 (two) times daily.  . bisacodyl (BISACODYL) 5 MG EC tablet Take 5 mg by mouth 2 (two) times daily as needed for moderate constipation.  . clidinium-chlordiazePOXIDE (LIBRAX) 5-2.5 MG per capsule Take 1 capsule by mouth daily.   . clindamycin (CLEOCIN) 150 MG capsule Take 600 mg by mouth as directed. Prior to dentist appointments  . clonazePAM (KLONOPIN) 2 MG tablet Take 1 mg by mouth at bedtime.   . cyanocobalamin (,VITAMIN B-12,) 1000 MCG/ML injection Inject 1,000 mcg into the muscle every 30 (thirty) days.   Marland Kitchen diltiazem (CARDIZEM) 60 MG tablet  Take 60 mg by mouth 2 (two) times daily.  Marland Kitchen diltiazem (CARDIZEM) 60 MG tablet Take 1 tablet (60 mg total) by mouth 2 (two) times daily.  Marland Kitchen esomeprazole (NEXIUM) 40 MG capsule Take 40 mg by mouth daily at 12 noon.  . Fluticasone-Salmeterol (ADVAIR) 250-50 MCG/DOSE AEPB Inhale 1 puff into the lungs as needed.  . folic acid (FOLVITE) 1 MG tablet Take 1 mg by mouth 2 (two) times daily.  . furosemide (LASIX) 20 MG tablet Take one tablet once a day as needed for swelling or shortness of breath.  Marland Kitchen glucose blood test strip 1 each by Other route as needed for other. Use as instructed  . Lancets 28G MISC 28 g by Does not apply route daily.  Marland Kitchen levothyroxine (SYNTHROID, LEVOTHROID) 112 MCG tablet Take 1 tablet (112 mcg total) by mouth daily before breakfast.  . magnesium oxide (MAG-OX) 400 MG tablet Take 400 mg by mouth daily.   . metFORMIN (GLUCOPHAGE) 1000 MG tablet Take 1,000 mg by mouth 2 (two) times daily with a meal.  . Methotrexate, PF, 25 MG/0.4ML SOAJ Inject 15 mg into the skin once a week.  Marland Kitchen oxyCODONE (ROXICODONE) 15 MG immediate release tablet Take 15 mg by mouth as needed for pain. Take 1 1/2 tablet  four times a day  . potassium chloride (KLOR-CON) 20 MEQ packet Take 20 mEq by mouth 2 (two) times daily.  . promethazine (PHENERGAN) 25 MG tablet Take 25 mg by mouth as needed for nausea or vomiting. Take 1 tablet four times a day as needed  . sotalol (BETAPACE) 120 MG tablet Take 1 tablet (120 mg total) by mouth 2 (two) times daily.  Marland Kitchen. triamcinolone (NASACORT AQ) 55 MCG/ACT AERO nasal inhaler Place 1 spray into the nose daily.  . Tuberculin-Allergy Syringes (B-D TB SYRINGE 1CC/22GX1") 22G X 1" 1 ML MISC by Does not apply route. Use as directed  . Ustekinumab (STELARA) 90 MG/ML SOSY Inject into the skin as needed. As needed for 3 months  . [DISCONTINUED] levothyroxine (SYNTHROID, LEVOTHROID) 88 MCG tablet Take 1 tablet (88 mcg total) by mouth daily before breakfast.   No facility-administered  encounter medications on file as of 04/07/2016.   ALLERGIES: Allergies  Allergen Reactions  . Penicillins Anaphylaxis  . Strawberry Extract Anaphylaxis  . Ace Inhibitors Other (See Comments)    Feeling Faint   . Pseudoephedrine Other (See Comments)    Makes heart race   VACCINATION STATUS:  There is no immunization history on file for this patient.  HPI  The patient presents today to follow-up on her history of hyperthyroidism.  - she is status post RAI  Ablative therapy for Graves' disease in April 2016. - she continues to improve in her symptoms On thyroid hormone replacement.    The patient has family history of thyroid dysfunction with various types in 3 sisters. - the patient denies personal history of goiter. Patient is not on any anti-thyroid medications nor on any thyroid hormone supplements currently. Patient  is willing to proceed with appropriate work up and therapy for thyrotoxicosis.   Review of Systems  Constitutional:  She has a steady weight , + fatigue  No subjective hyperthermia/hypothermia Eyes: no blurry vision, no xerophthalmia ENT: no sore throat, no nodules palpated in throat, no dysphagia/odynophagia, no hoarseness Cardiovascular: no CP/SOB/palpitations/leg swelling Respiratory: no cough/SOB Gastrointestinal: no N/V/D/C Musculoskeletal: no muscle/ +joint aches Skin: no rashes Neurological: no tremors/numbness/tingling/dizziness Psychiatric: no depression/anxiety   Objective:    BP 142/95 mmHg  Pulse 66  Ht 5\' 7"  (1.702 m)  Wt 206 lb (93.441 kg)  BMI 32.26 kg/m2  SpO2 98%  Wt Readings from Last 3 Encounters:  04/07/16 206 lb (93.441 kg)  01/29/16 209 lb (94.802 kg)  01/28/16 202 lb (91.627 kg)    Physical Exam  Constitutional: overweight, in NAD Eyes: PERRLA, EOMI, no exophthalmos ENT: moist mucous membranes, no thyromegaly, no cervical lymphadenopathy Cardiovascular: RRR, No MRG Respiratory: CTA B Gastrointestinal: abdomen soft, NT,  ND, BS+ Musculoskeletal: she has surgical scar on right knee, moderately tender , swollen left knee. strength intact in all 4 Skin: moist, warm, no rashes Neurological: no tremor with outstretched hands, DTR normal in all 4  Results for orders placed or performed in visit on 03/31/16  TSH  Result Value Ref Range   TSH 7.23 (H) mIU/L  T4, free  Result Value Ref Range   Free T4 1.4 0.8 - 1.8 ng/dL   Chemistry (most recent): Lab Results  Component Value Date   NA 135 10/30/2015   K 4.1 10/30/2015   CL 99* 10/30/2015   CO2 29 10/30/2015   BUN <5* 10/30/2015   CREATININE 0.57 10/30/2015     Assessment & Plan:   1. RAI induced hypothyroidism: - her labs are consistent with  treatment effect after RAI ablative therapy for Graves' disease. - She will Benefit from a slight increase in her dose of Synthroid. I will increase Synthroid to 112 g by mouth daily before breakfast.    - We discussed about correct intake of levothyroxine, at fasting, with water, separated by at least 30 minutes from breakfast, and separated by more than 4 hours from calcium, iron, multivitamins, acid reflux medications (PPIs). -Patient is made aware of the fact that thyroid hormone replacement is needed for life, dose to be adjusted by periodic monitoring of thyroid function tests.  -She has several other medical conditions including controlled type 2 diabetes I have advised her to continue close follow-up with her primary medical doctor.  Follow up plan: Return in about 6 months (around 10/07/2016) for underactive thyroid, follow up with pre-visit labs.  Marquis Lunch, MD Phone: [phone]  Fax: [phone]   04/07/2016, 4:24 PM

## 2016-04-28 ENCOUNTER — Telehealth: Payer: Self-pay | Admitting: *Deleted

## 2016-04-28 ENCOUNTER — Encounter: Payer: Self-pay | Admitting: Cardiology

## 2016-04-28 ENCOUNTER — Ambulatory Visit (INDEPENDENT_AMBULATORY_CARE_PROVIDER_SITE_OTHER): Payer: Medicare PPO | Admitting: Cardiology

## 2016-04-28 ENCOUNTER — Encounter: Payer: Self-pay | Admitting: *Deleted

## 2016-04-28 VITALS — BP 151/73 | HR 56 | Ht 67.0 in | Wt 209.0 lb

## 2016-04-28 DIAGNOSIS — I10 Essential (primary) hypertension: Secondary | ICD-10-CM | POA: Diagnosis not present

## 2016-04-28 DIAGNOSIS — I4891 Unspecified atrial fibrillation: Secondary | ICD-10-CM

## 2016-04-28 MED ORDER — APIXABAN 5 MG PO TABS: 5.0000 mg | ORAL_TABLET | Freq: Two times a day (BID) | ORAL | Status: DC

## 2016-04-28 MED ORDER — DILTIAZEM HCL 60 MG PO TABS: ORAL_TABLET | ORAL | Status: DC

## 2016-04-28 NOTE — Progress Notes (Signed)
Patient ID: Veronica Harrison, female   DOB: 10/19/1951, 65 y.o.   MRN: [id number]     Clinical Summary Ms. Lajean SilviusBateman is a 65 y.o.female seen today for follow up of the following medical problems.   1. Afib  - failed rate control strategy. Started on sotalol in EP clinic, with titration to 120mg  bid she has done very well. QTc has remained normal on routine checks.  - denies any palpitations since last visit. Can note some heart rates in mid 50s at home.    2. HTN - reports some low bp's to 80s/40s, often happens around 1pm a few hours taking meds. Can feel tired and sluggish at those times.     Past Medical History  Diagnosis Date  . Type 2 diabetes mellitus (HCC)   . Hypertension   . Psoriatic arthritis (HCC)   . Chronic pain   . Atrial fibrillation (HCC)   . Vitamin D deficiency   . Graves' disease      Allergies  Allergen Reactions  . Penicillins Anaphylaxis  . Strawberry Extract Anaphylaxis  . Ace Inhibitors Other (See Comments)    Feeling Faint   . Pseudoephedrine Other (See Comments)    Makes heart race     Current Outpatient Prescriptions  Medication Sig Dispense Refill  . acetaminophen (TYLENOL) 500 MG tablet Take 500 mg by mouth 2 (two) times daily as needed.    Marland Kitchen. albuterol (PROVENTIL) (2.5 MG/3ML) 0.083% nebulizer solution Take 2.5 mg by nebulization every 6 (six) hours as needed for wheezing or shortness of breath.    Marland Kitchen. apixaban (ELIQUIS) 5 MG TABS tablet Take 1 tablet (5 mg total) by mouth 2 (two) times daily. 180 tablet 3  . bisacodyl (BISACODYL) 5 MG EC tablet Take 5 mg by mouth 2 (two) times daily as needed for moderate constipation.    . clidinium-chlordiazePOXIDE (LIBRAX) 5-2.5 MG per capsule Take 1 capsule by mouth daily.     . clindamycin (CLEOCIN) 150 MG capsule Take 600 mg by mouth as directed. Prior to dentist appointments    . clonazePAM (KLONOPIN) 2 MG tablet Take 1 mg by mouth at bedtime.     . cyanocobalamin (,VITAMIN B-12,) 1000 MCG/ML  injection Inject 1,000 mcg into the muscle every 30 (thirty) days.     Marland Kitchen. diltiazem (CARDIZEM) 60 MG tablet Take 60 mg by mouth 2 (two) times daily.    Marland Kitchen. diltiazem (CARDIZEM) 60 MG tablet Take 1 tablet (60 mg total) by mouth 2 (two) times daily. 60 tablet 6  . esomeprazole (NEXIUM) 40 MG capsule Take 40 mg by mouth daily at 12 noon.    . Fluticasone-Salmeterol (ADVAIR) 250-50 MCG/DOSE AEPB Inhale 1 puff into the lungs as needed.    . folic acid (FOLVITE) 1 MG tablet Take 1 mg by mouth 2 (two) times daily.    . furosemide (LASIX) 20 MG tablet Take one tablet once a day as needed for swelling or shortness of breath. 30 tablet 6  . glucose blood test strip 1 each by Other route as needed for other. Use as instructed    . Lancets 28G MISC 28 g by Does not apply route daily.    Marland Kitchen. levothyroxine (SYNTHROID, LEVOTHROID) 112 MCG tablet Take 1 tablet (112 mcg total) by mouth daily before breakfast. 30 tablet 6  . magnesium oxide (MAG-OX) 400 MG tablet Take 400 mg by mouth daily.     . metFORMIN (GLUCOPHAGE) 1000 MG tablet Take 1,000 mg by mouth 2 (two) times  daily with a meal.    . Methotrexate, PF, 25 MG/0.4ML SOAJ Inject 15 mg into the skin once a week.    Marland Kitchen oxyCODONE (ROXICODONE) 15 MG immediate release tablet Take 15 mg by mouth as needed for pain. Take 1 1/2 tablet four times a day    . potassium chloride (KLOR-CON) 20 MEQ packet Take 20 mEq by mouth 2 (two) times daily.    . promethazine (PHENERGAN) 25 MG tablet Take 25 mg by mouth as needed for nausea or vomiting. Take 1 tablet four times a day as needed    . sotalol (BETAPACE) 120 MG tablet Take 1 tablet (120 mg total) by mouth 2 (two) times daily. 60 tablet 6  . triamcinolone (NASACORT AQ) 55 MCG/ACT AERO nasal inhaler Place 1 spray into the nose daily.    . Tuberculin-Allergy Syringes (B-D TB SYRINGE 1CC/22GX1") 22G X 1" 1 ML MISC by Does not apply route. Use as directed    . Ustekinumab (STELARA) 90 MG/ML SOSY Inject into the skin as needed. As  needed for 3 months     No current facility-administered medications for this visit.     Past Surgical History  Procedure Laterality Date  . Varicose vein surgery Left   . Cholecystectomy    . Total knee arthroplasty Right   . Abdominal hysterectomy       Allergies  Allergen Reactions  . Penicillins Anaphylaxis  . Strawberry Extract Anaphylaxis  . Ace Inhibitors Other (See Comments)    Feeling Faint   . Pseudoephedrine Other (See Comments)    Makes heart race      Family History  Problem Relation Age of Onset  . Diabetes    . CAD       Social History Ms. Larivee reports that she quit smoking about 11 years ago. Her smoking use included Cigarettes. She started smoking about 46 years ago. She has a 34 pack-year smoking history. She has never used smokeless tobacco. Ms. Soley reports that she does not drink alcohol.   Review of Systems CONSTITUTIONAL: No weight loss, fever, chills, weakness or fatigue.  HEENT: Eyes: No visual loss, blurred vision, double vision or yellow sclerae.No hearing loss, sneezing, congestion, runny nose or sore throat.  SKIN: No rash or itching.  CARDIOVASCULAR: per HPI RESPIRATORY: No shortness of breath, cough or sputum.  GASTROINTESTINAL: No anorexia, nausea, vomiting or diarrhea. No abdominal pain or blood.  GENITOURINARY: No burning on urination, no polyuria NEUROLOGICAL: No headache, dizziness, syncope, paralysis, ataxia, numbness or tingling in the extremities. No change in bowel or bladder control.  MUSCULOSKELETAL: No muscle, back pain, joint pain or stiffness.  LYMPHATICS: No enlarged nodes. No history of splenectomy.  PSYCHIATRIC: No history of depression or anxiety.  ENDOCRINOLOGIC: No reports of sweating, cold or heat intolerance. No polyuria or polydipsia.  Marland Kitchen   Physical Examination Filed Vitals:   04/28/16 1256  BP: 151/73  Pulse: 56   Filed Vitals:   04/28/16 1256  Height: 5\' 7"  (1.702 m)  Weight: 209 lb (94.802  kg)    Gen: resting comfortably, no acute distress HEENT: no scleral icterus, pupils equal round and reactive, no palptable cervical adenopathy,  CV: RRR, no m/r/g, no jvd Resp: Clear to auscultation bilaterally GI: abdomen is soft, non-tender, non-distended, normal bowel sounds, no hepatosplenomegaly MSK: extremities are warm, no edema.  Skin: warm, no rash Neuro:  no focal deficits Psych: appropriate affect   Diagnostic Studies 07/2014 Echo Study Conclusions  - Left ventricle: The cavity  size was normal. Systolic function was normal. The estimated ejection fraction was in the range of 60% to 65%. Wall motion was normal; there were no regional wall motion abnormalities. Features are consistent with a pseudonormal left ventricular filling pattern, with concomitant abnormal relaxation and increased filling pressure (grade 2 diastolic dysfunction). Moderate posterior wall and mild septal hypertrophy. - Mitral valve: There was trivial regurgitation. - Left atrium: The atrium was mildly dilated.  06/2015 Holter monitor [place] [facility]: short episodes of afib with RVR   01/29/16 Clinic EKG (performed and reviewed in clinic): normal sinus rhythm, normal QTc.     Assessment and Plan  1. Afib - maintaining sinus rhythm on sotalol, her EKG in clinic today  show sinus rhythm with normal QTc - CHADS2Vasc score of 3(gender, age, DM2), continue eliquis.  - low heart rates and bp's in the afternoons, decrease her AM dilt to , continue evening dose at .   2. HTN - reports low bp's in the afternoons, we will lower her AM dilt to .    F/u 6 months. Request pcp labs    Antoine Poche, M.D.

## 2016-04-28 NOTE — Telephone Encounter (Signed)
Pt made aware

## 2016-04-28 NOTE — Telephone Encounter (Signed)
-----   Message from Antoine PocheJonathan F Branch, MD sent at 04/28/2016  3:07 PM EDT ----- In limited quantities, can increase risk of bleeding on blood thinner. 2-3 times a week should be ok if [name]  J Branch MD ----- Message -----    From: Albertine PatriciaStaci T Kandance Yano, CMA    Sent: 04/28/2016   2:30 PM      To: Antoine PocheJonathan F Branch, MD  Pt wants to know if ok to take Celebrex.   [name]

## 2016-04-28 NOTE — Patient Instructions (Signed)
Your physician wants you to follow-up in: 6 MONTHS WITH DR. BRANCH You will receive a reminder letter in the mail two months in advance. If you don't receive a letter, please call our office to schedule the follow-up appointment.  Your physician has recommended you make the following change in your medication:   DECREASE DILTIAZEM 30 MG IN THE MORNING AND 60 MG IN THE EVENING   Thank you for choosing [facility]!!

## 2016-04-29 ENCOUNTER — Other Ambulatory Visit: Payer: Self-pay | Admitting: *Deleted

## 2016-04-29 MED ORDER — APIXABAN 5 MG PO TABS: 5.0000 mg | ORAL_TABLET | Freq: Two times a day (BID) | ORAL | Status: DC

## 2016-04-29 MED ORDER — DILTIAZEM HCL 60 MG PO TABS: 60.0000 mg | ORAL_TABLET | Freq: Two times a day (BID) | ORAL | Status: DC

## 2016-05-07 ENCOUNTER — Telehealth: Payer: Self-pay | Admitting: *Deleted

## 2016-05-07 NOTE — Telephone Encounter (Signed)
Pt recent visit with Dr. Burna MortimerIsernia he added prednisone 5 mg daily - says it could be for the next 3 months due to cortisol results according to pt - would like to know if ok to take with cardiac medications. Says she also went back to taking diltiazem 60 mg bid since waking up up not feeling well. Will forward to Dr. Wyline MoodBranch

## 2016-05-07 NOTE — Telephone Encounter (Signed)
Pt was approved by [employer] patient assistance [employer] for Eliquis through 12/28/2016. Application case # [id number]

## 2016-05-08 NOTE — Telephone Encounter (Signed)
Prednisone is acceptable with her heart meds. At that low a dose it is unlikely but could cause some increased palpitations. Im ok with her going back to higher dilt dose   Dominga FerryJ Jayani Rozman MD

## 2016-05-09 NOTE — Telephone Encounter (Signed)
Pt made aware

## 2016-06-04 ENCOUNTER — Other Ambulatory Visit: Payer: Self-pay | Admitting: Cardiology

## 2016-07-02 ENCOUNTER — Telehealth: Payer: Self-pay | Admitting: *Deleted

## 2016-07-02 NOTE — Telephone Encounter (Signed)
Pt aware.

## 2016-07-02 NOTE — Telephone Encounter (Signed)
That is fine   J Veronica Levengood MD 

## 2016-07-02 NOTE — Telephone Encounter (Signed)
Pt says Dr. Dierdre ForthBeekman is starting Consentyx injections titration up from 150 mg to 300 mg - pt wants to know if this is ok to take with cardiac medication. Will forward to Dr. Wyline MoodBranch

## 2016-07-21 DIAGNOSIS — R339 Retention of urine, unspecified: Secondary | ICD-10-CM | POA: Insufficient documentation

## 2016-09-25 ENCOUNTER — Telehealth: Payer: Self-pay | Admitting: *Deleted

## 2016-09-25 NOTE — Telephone Encounter (Signed)
Pt aware and appreciative of call back

## 2016-09-25 NOTE — Telephone Encounter (Signed)
The safest thing for her would be a nasal spray flonase or antihistamine like allegra or claritin. Anyother decongestant will have pseudoephredrine or phenylephrine in it, which we try to avoid in patients with history of high blood pressure. If she has cough she can use products with detromethorphan or guafenessin in them, but this will not really help her congestion.   Dominga FerryJ Ottie Tillery MD

## 2016-09-25 NOTE — Telephone Encounter (Signed)
Pt has sinus congestion for last week and wanted to know safe OTC decongestant she could take - will forward to Dr. Wyline MoodBranch

## 2016-10-08 ENCOUNTER — Ambulatory Visit: Payer: Medicare PPO | Admitting: "Endocrinology

## 2016-10-08 ENCOUNTER — Telehealth: Payer: Self-pay | Admitting: *Deleted

## 2016-10-08 NOTE — Telephone Encounter (Signed)
Pt requesting Dr. Verna CzechBranch's recommendation on taking the high dose flu shot since she is almost 65 y/o or if she should take the Quadrivalent with being on multiple medications. Will forward to Dr. Wyline MoodBranch

## 2016-10-09 NOTE — Telephone Encounter (Signed)
I would defer that decision to her primary care doctor, who would be able to answer her question better than myself.   Veronica FerryJ Taylar Hartsough MD

## 2016-10-09 NOTE — Telephone Encounter (Signed)
Pt aware - says she will talk with Dr. Burna MortimerIsernia regarding flu shot

## 2016-10-17 ENCOUNTER — Encounter: Payer: Self-pay | Admitting: *Deleted

## 2016-10-20 ENCOUNTER — Ambulatory Visit (INDEPENDENT_AMBULATORY_CARE_PROVIDER_SITE_OTHER): Payer: Medicare PPO | Admitting: Cardiology

## 2016-10-20 ENCOUNTER — Encounter: Payer: Self-pay | Admitting: Cardiology

## 2016-10-20 ENCOUNTER — Telehealth: Payer: Self-pay | Admitting: Cardiology

## 2016-10-20 ENCOUNTER — Encounter: Payer: Self-pay | Admitting: *Deleted

## 2016-10-20 VITALS — BP 129/78 | HR 66 | Ht 67.0 in | Wt 221.0 lb

## 2016-10-20 DIAGNOSIS — I4891 Unspecified atrial fibrillation: Secondary | ICD-10-CM

## 2016-10-20 DIAGNOSIS — I10 Essential (primary) hypertension: Secondary | ICD-10-CM | POA: Diagnosis not present

## 2016-10-20 NOTE — Telephone Encounter (Signed)
[facility] [place] [facility] asked for patient to call us in the billing department.  We need more information regarding the coverage that she is asking about.

## 2016-10-20 NOTE — Progress Notes (Signed)
Clinical Summary [name] is a 65 y.o.female seen today for follow up of the following medical problems.    1. Afib  - failed rate control strategy. Started on sotalol in EP clinic, with titration to 120mg  bid she has done very well. QTc has remained normal on routine checks.   - no recent palpitations. No bleeding troubles on eliquis.    2. HTN - compliant with meds    Past Medical History:  Diagnosis Date  . Atrial fibrillation (HCC)   . Chronic pain   . Graves' disease   . Hypertension   . Psoriatic arthritis (HCC)   . Type 2 diabetes mellitus (HCC)   . Vitamin D deficiency      Allergies  Allergen Reactions  . Penicillins Anaphylaxis  . Strawberry Extract Anaphylaxis  . Ace Inhibitors Other (See Comments)    Feeling Faint   . Pseudoephedrine Other (See Comments)    Makes heart race     Current Outpatient Prescriptions  Medication Sig Dispense Refill  . acetaminophen (TYLENOL) 500 MG tablet Take 500 mg by mouth 2 (two) times daily as needed.    Marland Kitchen. albuterol (PROVENTIL) (2.5 MG/3ML) 0.083% nebulizer solution Take 2.5 mg by nebulization every 6 (six) hours as needed for wheezing or shortness of breath.    Marland Kitchen. apixaban (ELIQUIS) 5 MG TABS tablet Take 1 tablet (5 mg total) by mouth 2 (two) times daily. 180 tablet 3  . bisacodyl (BISACODYL) 5 MG EC tablet Take 5 mg by mouth 2 (two) times daily as needed for moderate constipation.    . clidinium-chlordiazePOXIDE (LIBRAX) 5-2.5 MG per capsule Take 1 capsule by mouth daily.     . clindamycin (CLEOCIN) 150 MG capsule Take 600 mg by mouth as directed. Prior to dentist appointments    . clonazePAM (KLONOPIN) 2 MG tablet Take 1 mg by mouth at bedtime.     Marland Kitchen. diltiazem (CARDIZEM) 60 MG tablet Take 1 tablet (60 mg total) by mouth 2 (two) times daily. 180 tablet 3  . esomeprazole (NEXIUM) 40 MG capsule Take 40 mg by mouth daily as needed.     . Fluticasone-Salmeterol (ADVAIR) 250-50 MCG/DOSE AEPB Inhale 1 puff into the  lungs as needed.    . folic acid (FOLVITE) 1 MG tablet Take 1 mg by mouth 2 (two) times daily.    . furosemide (LASIX) 20 MG tablet Take one tablet once a day as needed for swelling or shortness of breath. 30 tablet 6  . glucose blood test strip 1 each by Other route as needed for other. Use as instructed    . Lancets 28G MISC 28 g by Does not apply route daily.    Marland Kitchen. levothyroxine (SYNTHROID, LEVOTHROID) 112 MCG tablet Take 1 tablet (112 mcg total) by mouth daily before breakfast. 30 tablet 6  . magnesium oxide (MAG-OX) 400 MG tablet Take 400 mg by mouth daily.     . metFORMIN (GLUCOPHAGE) 1000 MG tablet Take 1,000 mg by mouth 2 (two) times daily with a meal.    . Methotrexate, PF, 25 MG/0.4ML SOAJ Inject 15 mg into the skin once a week.    Marland Kitchen. oxyCODONE (ROXICODONE) 15 MG immediate release tablet Take 15 mg by mouth as needed for pain. Take 1 1/2 tablet four times a day    . potassium chloride (KLOR-CON) 20 MEQ packet Take 20 mEq by mouth 2 (two) times daily.    . promethazine (PHENERGAN) 25 MG tablet Take 25 mg by mouth  as needed for nausea or vomiting. Take 1 tablet four times a day as needed    . sotalol (BETAPACE) 120 MG tablet TAKE (1) TABLET TWICE DAILY. 60 tablet 6  . triamcinolone (NASACORT AQ) 55 MCG/ACT AERO nasal inhaler Place 1 spray into the nose daily as needed.     . Tuberculin-Allergy Syringes (B-D TB SYRINGE 1CC/22GX1") 22G X 1" 1 ML MISC by Does not apply route. Use as directed    . Ustekinumab (STELARA) 90 MG/ML SOSY Inject 90 mg into the skin every 3 (three) months.      No current facility-administered medications for this visit.      Past Surgical History:  Procedure Laterality Date  . ABDOMINAL HYSTERECTOMY    . CHOLECYSTECTOMY    . TOTAL KNEE ARTHROPLASTY Right   . VARICOSE VEIN SURGERY Left      Allergies  Allergen Reactions  . Penicillins Anaphylaxis  . Strawberry Extract Anaphylaxis  . Ace Inhibitors Other (See Comments)    Feeling Faint   .  Pseudoephedrine Other (See Comments)    Makes heart race      Family History  Problem Relation Age of Onset  . Diabetes    . CAD       Social History Veronica Harrison reports that she quit smoking about 12 years ago. Her smoking use included Cigarettes. She started smoking about 46 years ago. She has a 34.00 pack-year smoking history. She has never used smokeless tobacco. Veronica Harrison reports that she does not drink alcohol.   Review of Systems CONSTITUTIONAL: No weight loss, fever, chills, weakness or fatigue.  HEENT: Eyes: No visual loss, blurred vision, double vision or yellow sclerae.No hearing loss, sneezing, congestion, runny nose or sore throat.  SKIN: No rash or itching.  CARDIOVASCULAR: per hpi RESPIRATORY: No shortness of breath, cough or sputum.  GASTROINTESTINAL: No anorexia, nausea, vomiting or diarrhea. No abdominal pain or blood.  GENITOURINARY: No burning on urination, no polyuria NEUROLOGICAL: No headache, dizziness, syncope, paralysis, ataxia, numbness or tingling in the extremities. No change in bowel or bladder control.  MUSCULOSKELETAL: No muscle, back pain, joint pain or stiffness.  LYMPHATICS: No enlarged nodes. No history of splenectomy.  PSYCHIATRIC: No history of depression or anxiety.  ENDOCRINOLOGIC: No reports of sweating, cold or heat intolerance. No polyuria or polydipsia.  Marland Kitchen   Physical Examination Vitals:   10/20/16 1449  BP: 129/78  Pulse: 66   Vitals:   10/20/16 1449  Weight: 221 lb (100.2 kg)  Height: 5\' 7"  (1.702 m)    Gen: resting comfortably, no acute distress HEENT: no scleral icterus, pupils equal round and reactive, no palptable cervical adenopathy,  CV: RRR, no m/r/g, no jvd Resp: Clear to auscultation bilaterally GI: abdomen is soft, non-tender, non-distended, normal bowel sounds, no hepatosplenomegaly MSK: extremities are warm, no edema.  Skin: warm, no rash Neuro:  no focal deficits Psych: appropriate affect   Diagnostic  Studies 07/2014 Echo Study Conclusions  - Left ventricle: The cavity size was normal. Systolic function was normal. The estimated ejection fraction was in the range of 60% to 65%. Wall motion was normal; there were no regional wall motion abnormalities. Features are consistent with a pseudonormal left ventricular filling pattern, with concomitant abnormal relaxation and increased filling pressure (grade 2 diastolic dysfunction). Moderate posterior wall and mild septal hypertrophy. - Mitral valve: There was trivial regurgitation. - Left atrium: The atrium was mildly dilated.  06/2015 Holter monitor [place] [facility]: short episodes of afib with RVR  01/29/16 Clinic EKG (performed and reviewed in clinic): normal sinus rhythm, normal QTc.     Assessment and Plan  1. Afib - maintaining sinus rhythm on sotalol, her EKG in clinic today  show sinus rhythm with normal QTc - CHADS2Vasc score of 3(gender, age, DM2), she will continue eliquis.    2. HTN - at goal, continue current meds   F/u 6 months. Request pcp labs      Antoine Poche, M.D

## 2016-10-20 NOTE — Patient Instructions (Signed)

## 2016-10-20 NOTE — Telephone Encounter (Signed)
PATIENT has question about if we accept [employer] as Primary?????   Was told there would only be $20 co-pay

## 2016-12-19 ENCOUNTER — Other Ambulatory Visit: Payer: Self-pay | Admitting: *Deleted

## 2016-12-19 MED ORDER — SOTALOL HCL 120 MG PO TABS: ORAL_TABLET | ORAL | 6 refills | Status: DC

## 2017-01-20 ENCOUNTER — Other Ambulatory Visit: Payer: Self-pay | Admitting: Cardiology

## 2017-01-21 ENCOUNTER — Telehealth: Payer: Self-pay | Admitting: Cardiology

## 2017-01-21 NOTE — Telephone Encounter (Signed)
Patient is need of Eliquis refill  Stated she has filled out what company sent her but is lost as to what all she needs to do to get medication.

## 2017-01-21 NOTE — Telephone Encounter (Signed)
Pt will bring in [employer] pt assistance application says she has 1 month supply of Eliquis left - pt aware to let us know before she runs out

## 2017-02-02 ENCOUNTER — Other Ambulatory Visit: Payer: Self-pay | Admitting: *Deleted

## 2017-02-02 MED ORDER — APIXABAN 5 MG PO TABS: 5.0000 mg | ORAL_TABLET | Freq: Two times a day (BID) | ORAL | 3 refills | Status: DC

## 2017-03-03 ENCOUNTER — Ambulatory Visit (INDEPENDENT_AMBULATORY_CARE_PROVIDER_SITE_OTHER): Payer: Medicare Other | Admitting: Sports Medicine

## 2017-03-03 ENCOUNTER — Ambulatory Visit (INDEPENDENT_AMBULATORY_CARE_PROVIDER_SITE_OTHER): Payer: Medicare Other

## 2017-03-03 DIAGNOSIS — M79671 Pain in right foot: Secondary | ICD-10-CM

## 2017-03-03 DIAGNOSIS — L4050 Arthropathic psoriasis, unspecified: Secondary | ICD-10-CM

## 2017-03-03 DIAGNOSIS — M25572 Pain in left ankle and joints of left foot: Secondary | ICD-10-CM

## 2017-03-03 DIAGNOSIS — Q828 Other specified congenital malformations of skin: Secondary | ICD-10-CM

## 2017-03-03 DIAGNOSIS — E1142 Type 2 diabetes mellitus with diabetic polyneuropathy: Secondary | ICD-10-CM

## 2017-03-03 DIAGNOSIS — B351 Tinea unguium: Secondary | ICD-10-CM | POA: Diagnosis not present

## 2017-03-03 DIAGNOSIS — M79672 Pain in left foot: Secondary | ICD-10-CM | POA: Diagnosis not present

## 2017-03-03 DIAGNOSIS — L84 Corns and callosities: Secondary | ICD-10-CM

## 2017-03-03 NOTE — Progress Notes (Signed)
Subjective: Veronica Harrison is a 66 y.o. female patient with history of diabetes who presents to office today complaining of long, painful callus and nails while ambulating in shoes; unable to trim. Patient states that the glucose reading this morning was not recorded. Patient denies any new changes in medication or new problems. Patient denies any new cramping, numbness, burning or tingling in the legs. Admits to ankle swelling and pain with recent sprain and pain in both feet.   HPI/ROS per nurse not  Patient Active Problem List   Diagnosis Date Noted  . 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 A-fib ([place]) 03/10/2015  . HTN (hypertension) 03/10/2015  . DM2 (diabetes mellitus, type 2) ([place]) 03/10/2015   Current Outpatient Prescriptions on File Prior to Visit  Medication Sig Dispense Refill  . acetaminophen (TYLENOL) 500 MG tablet Take 500 mg by mouth 2 (two) times daily as needed.    Marland Kitchen albuterol (PROVENTIL) (2.5 MG/3ML) 0.083% nebulizer solution Take 2.5 mg by nebulization every 6 (six) hours as needed for wheezing or shortness of breath.    Marland Kitchen apixaban (ELIQUIS) 5 MG TABS tablet Take 1 tablet (5 mg total) by mouth 2 (two) times daily. 180 tablet 3  . bisacodyl (BISACODYL) 5 MG EC tablet Take 5 mg by mouth 2 (two) times daily as needed for moderate constipation.    . clidinium-chlordiazePOXIDE (LIBRAX) 5-2.5 MG per capsule Take 1 capsule by mouth daily.     . clindamycin (CLEOCIN) 150 MG capsule Take 600 mg by mouth as directed. Prior to dentist appointments    . clonazePAM (KLONOPIN) 2 MG tablet Take 2 mg by mouth at bedtime.     Marland Kitchen diltiazem (CARDIZEM) 60 MG tablet Take 1 tablet (60 mg total) by mouth 2 (two) times daily. 180 tablet 3  . esomeprazole (NEXIUM) 40 MG capsule Take 40 mg by mouth daily as needed.     . Fluticasone-Salmeterol (ADVAIR) 250-50 MCG/DOSE AEPB Inhale 1 puff into the lungs as needed.    . folic acid (FOLVITE) 1 MG tablet Take 1  mg by mouth 2 (two) times daily.    . furosemide (LASIX) 20 MG tablet Take one tablet once a day as needed for swelling or shortness of breath. 30 tablet 6  . glucose blood test strip 1 each by Other route as needed for other. Use as instructed    . Lancets 28G MISC 28 g by Does not apply route daily.    Marland Kitchen levothyroxine (SYNTHROID, LEVOTHROID) 150 MCG tablet Take 150 mcg by mouth daily before breakfast.    . magnesium oxide (MAG-OX) 400 MG tablet Take 400 mg by mouth daily.     . metFORMIN (GLUCOPHAGE) 1000 MG tablet Take 1,000 mg by mouth 2 (two) times daily with a meal.    . Methotrexate, PF, 25 MG/0.4ML SOAJ Inject 15 mg into the skin once a week.    Marland Kitchen oxyCODONE (ROXICODONE) 15 MG immediate release tablet Take 15 mg by mouth as needed for pain. Take 1 1/2 tablet four times a day    . oxyCODONE (ROXICODONE) 15 MG immediate release tablet Take 15 mg by mouth 4 (four) times daily as needed.    . potassium chloride (KLOR-CON) 20 MEQ packet Take 20 mEq by mouth 2 (two) times daily.    . promethazine (PHENERGAN) 25 MG tablet Take 25 mg by mouth as needed for nausea or vomiting. Take 1 tablet four times a day as needed    .  Secukinumab (COSENTYX 300 DOSE) 150 MG/ML SOSY Inject 300 mg into the skin every 30 (thirty) days.    . sotalol (BETAPACE) 120 MG tablet TAKE (1) TABLET TWICE DAILY. 60 tablet 0  . triamcinolone (NASACORT AQ) 55 MCG/ACT AERO nasal inhaler Place 1 spray into the nose daily as needed.     . Tuberculin-Allergy Syringes (B-D TB SYRINGE 1CC/22GX1") 22G X 1" 1 ML MISC by Does not apply route. Use as directed     No current facility-administered medications on file prior to visit.    Allergies  Allergen Reactions  . Penicillins Anaphylaxis  . Strawberry Extract Anaphylaxis  . Ace Inhibitors Other (See Comments)    Feeling Faint   . Pseudoephedrine Other (See Comments)    Makes heart race    No results found for this or any previous visit (from the past 2160  hour(s)).  Objective: General: Patient is awake, alert, and oriented x 3 and in no acute distress.  Integument: Skin is warm, dry and supple bilateral. Nails are tender, long, thickened and dystrophic with subungual debris, consistent with onychomycosis, 1-5 bilateral. Callus medial 1st MTPJ bilateral. No signs of infection. No open lesions or preulcerative lesions present bilateral. Remaining integument unremarkable.  Vasculature:  Dorsalis Pedis pulse 1/4 bilateral. Posterior Tibial pulse  1/4 bilateral. Capillary fill time <3 sec 1-5 bilateral. Positive hair growth to the level of the digits. Temperature gradient within normal limits. No varicosities present bilateral. Focal edema on left ankle.   Neurology: The patient has intact sensation measured with a 5.07/10g Semmes Weinstein Monofilament at all pedal sites bilateral . Vibratory sensation diminished bilateral with tuning fork. No Babinski sign present bilateral.   Musculoskeletal: No symptomatic pedal deformities noted bilateral. Muscular strength 5/5 in all lower extremity muscular groups bilateral without pain on range of motion. No tenderness with calf compression bilateral.  Xray: Left old fracture at 3rd metatarsal, diffuse arthritis bilateral with square 2-4 met heads, ankle arthritis, no other acute findings.   Assessment and Plan: Problem List Items Addressed This Visit    None    Visit Diagnoses    Dermatophytosis of nail    -  Primary   Bilateral foot pain       Relevant Orders   DG Foot 2 Views Left (Completed)   DG Foot 2 Views Right (Completed)   Left ankle pain, unspecified chronicity       Relevant Orders   DG Ankle 2 Views Left (Completed)   Callus of foot       Diabetic polyneuropathy associated with type 2 diabetes mellitus ([place])       Psoriatic arthritis ([place])          -Examined patient. -Discussed and educated patient on diabetic foot care, especially with regards to the vascular, neurological and  musculoskeletal systems.  -Stressed the importance of good glycemic control and the detriment of not controlling glucose levels in relation to the foot. -Mechanically debrided callus bilateral and all nails 1-5 bilateral using sterile nail nipper and filed with dremel without incident  -Applied met pads bilateral to shoes -Dispensed ankle compression sleeve and support on left -Answered all patient questions -Patient to return  in 3 months for at risk foot care -Patient advised to call the office if any problems or questions arise in the meantime.  Landis Martins, DPM

## 2017-03-03 NOTE — Progress Notes (Signed)
   Subjective:    Patient ID: Veronica Harrison, female    DOB: 01/29/1951, 66 y.o.   MRN: [id number]  HPI  Chief Complaint  Patient presents with  . Ankle Pain    Left; Lateral side x 6 months. Pt stated, "broke ankle in 2006, recently turned ankle couple weeks ago, pain has been worse"   . Foot Pain    Left; 3rd Metatarsal x "for years". Pt was told that it was broke in the area.   . Foot Pain    Right; Plantar Forefoot. Pt states that it feels like something hard is in the area.   . Debride    Diabetic; Last A1C 6.5. Pt Right Great toe nail is coming off, she is on eliquis so she doesn't want the toe nail removed.   . Callouses    BL; Medial and lateral sides of foot.        Review of Systems     Objective:   Physical Exam        Assessment & Plan:

## 2017-03-31 ENCOUNTER — Other Ambulatory Visit: Payer: Self-pay | Admitting: *Deleted

## 2017-03-31 MED ORDER — APIXABAN 5 MG PO TABS: 5.0000 mg | ORAL_TABLET | Freq: Two times a day (BID) | ORAL | 0 refills | Status: DC

## 2017-04-08 ENCOUNTER — Telehealth: Payer: Self-pay

## 2017-04-08 NOTE — Telephone Encounter (Signed)
Ok to take coridin HBP  Dominga Ferry MD

## 2017-04-08 NOTE — Telephone Encounter (Signed)
Patient is taking Coricidin HBP and wants to know if this is safe with all her cardiac medications Fwd to Dr. Wyline Mood

## 2017-04-08 NOTE — Telephone Encounter (Signed)
Pt aware.

## 2017-04-10 ENCOUNTER — Telehealth: Payer: Self-pay | Admitting: Cardiology

## 2017-04-10 NOTE — Telephone Encounter (Signed)
Pt confirmed she was taking albuterol 90 mcg 2 puffs q6h as needed - explained that albuterol may increase heart rate - pt voiced understanding

## 2017-04-10 NOTE — Telephone Encounter (Signed)
Which inhaler was she given. I would say generally speaking ok to use. Albuterol can cause some increase in heart rates, but there is not a strong correlation with throwing her back into afib that would cause to avoid medication   J Clinton Dragone MD

## 2017-04-10 NOTE — Telephone Encounter (Signed)
Patient has FLU.  She was given an inhaler.  Would like to know if it is safe for her to use without causing her to go into AFIB

## 2017-04-13 ENCOUNTER — Telehealth: Payer: Self-pay | Admitting: Cardiology

## 2017-04-13 NOTE — Telephone Encounter (Signed)
Patient stated the albuterol was not doing the trick so she is now taking atrovent as well as the albuterol. Patient wants to make sure this is ok with Dr. Wyline Mood. Patient going in for a chest xray at this time.

## 2017-04-13 NOTE — Telephone Encounter (Signed)
Atrovent is ok from our standpoint as well.   Dominga Ferry MD

## 2017-04-13 NOTE — Telephone Encounter (Signed)
Patient called in regards to starting Atrovent . Please call cell phone

## 2017-04-14 DIAGNOSIS — N8110 Cystocele, unspecified: Secondary | ICD-10-CM | POA: Insufficient documentation

## 2017-04-15 ENCOUNTER — Other Ambulatory Visit: Payer: Self-pay | Admitting: Cardiology

## 2017-04-20 ENCOUNTER — Encounter: Payer: Self-pay | Admitting: Cardiology

## 2017-04-20 ENCOUNTER — Ambulatory Visit (INDEPENDENT_AMBULATORY_CARE_PROVIDER_SITE_OTHER): Payer: Medicare Other | Admitting: Cardiology

## 2017-04-20 VITALS — BP 155/83 | HR 75 | Ht 67.0 in | Wt 221.0 lb

## 2017-04-20 DIAGNOSIS — I4891 Unspecified atrial fibrillation: Secondary | ICD-10-CM | POA: Diagnosis not present

## 2017-04-20 DIAGNOSIS — I10 Essential (primary) hypertension: Secondary | ICD-10-CM | POA: Diagnosis not present

## 2017-04-20 MED ORDER — FUROSEMIDE 20 MG PO TABS: ORAL_TABLET | ORAL | 1 refills | Status: DC

## 2017-04-20 NOTE — Progress Notes (Signed)
Clinical Summary Veronica Harrison is a 66 y.o.female seen today for follow up of the following medical problems.    1. Afib  - failed rate control strategy. Started on sotalol in EP clinic and has done well. QTc has remained normal on routine checks.   - easy bruising on eliquis but no bleeding. No recent palpitations.    2. HTN - home bp's running 130s/60s - compliant with meds   3. Cough/SOB - on steroids and abx per pcp  Past Medical History:  Diagnosis Date  . Atrial fibrillation (HCC)   . Chronic pain   . Graves' disease   . Hypertension   . Psoriatic arthritis (HCC)   . Type 2 diabetes mellitus (HCC)   . Vitamin D deficiency      Allergies  Allergen Reactions  . Penicillins Anaphylaxis  . Strawberry Extract Anaphylaxis  . Ace Inhibitors Other (See Comments)    Feeling Faint   . Pseudoephedrine Other (See Comments)    Makes heart race     Current Outpatient Prescriptions  Medication Sig Dispense Refill  . acetaminophen (TYLENOL) 500 MG tablet Take 500 mg by mouth 2 (two) times daily as needed.    Marland Kitchen albuterol (PROVENTIL) (2.5 MG/3ML) 0.083% nebulizer solution Take 2.5 mg by nebulization every 6 (six) hours as needed for wheezing or shortness of breath.    Marland Kitchen apixaban (ELIQUIS) 5 MG TABS tablet Take 1 tablet (5 mg total) by mouth 2 (two) times daily. 42 tablet 0  . bisacodyl (BISACODYL) 5 MG EC tablet Take 5 mg by mouth 2 (two) times daily as needed for moderate constipation.    . clidinium-chlordiazePOXIDE (LIBRAX) 5-2.5 MG per capsule Take 1 capsule by mouth daily.     . clindamycin (CLEOCIN) 150 MG capsule Take 600 mg by mouth as directed. Prior to dentist appointments    . clonazePAM (KLONOPIN) 2 MG tablet Take 2 mg by mouth at bedtime.     Marland Kitchen diltiazem (CARDIZEM) 60 MG tablet TAKE (1) TABLET TWICE DAILY. 60 tablet 1  . esomeprazole (NEXIUM) 40 MG capsule Take 40 mg by mouth daily as needed.     . Fluticasone-Salmeterol (ADVAIR) 250-50 MCG/DOSE AEPB  Inhale 1 puff into the lungs as needed.    . folic acid (FOLVITE) 1 MG tablet Take 1 mg by mouth 2 (two) times daily.    . furosemide (LASIX) 20 MG tablet Take one tablet once a day as needed for swelling or shortness of breath. 30 tablet 6  . glucose blood test strip 1 each by Other route as needed for other. Use as instructed    . Lancets 28G MISC 28 g by Does not apply route daily.    Marland Kitchen levothyroxine (SYNTHROID, LEVOTHROID) 150 MCG tablet Take 150 mcg by mouth daily before breakfast.    . magnesium oxide (MAG-OX) 400 MG tablet Take 400 mg by mouth daily.     . metFORMIN (GLUCOPHAGE) 1000 MG tablet Take 1,000 mg by mouth 2 (two) times daily with a meal.    . Methotrexate, PF, 25 MG/0.4ML SOAJ Inject 15 mg into the skin once a week.    Marland Kitchen oxyCODONE (ROXICODONE) 15 MG immediate release tablet Take 15 mg by mouth as needed for pain. Take 1 1/2 tablet four times a day    . oxyCODONE (ROXICODONE) 15 MG immediate release tablet Take 15 mg by mouth 4 (four) times daily as needed.    . potassium chloride (KLOR-CON) 20 MEQ packet Take 20  mEq by mouth 2 (two) times daily.    . promethazine (PHENERGAN) 25 MG tablet Take 25 mg by mouth as needed for nausea or vomiting. Take 1 tablet four times a day as needed    . Secukinumab (COSENTYX 300 DOSE) 150 MG/ML SOSY Inject 300 mg into the skin every 30 (thirty) days.    . sotalol (BETAPACE) 120 MG tablet TAKE (1) TABLET TWICE DAILY. 60 tablet 0  . sotalol (BETAPACE) 120 MG tablet TAKE ONE TABLET TWICE DAILY. 60 tablet 1  . triamcinolone (NASACORT AQ) 55 MCG/ACT AERO nasal inhaler Place 1 spray into the nose daily as needed.     . Tuberculin-Allergy Syringes (B-D TB SYRINGE 1CC/22GX1") 22G X 1" 1 ML MISC by Does not apply route. Use as directed     No current facility-administered medications for this visit.      Past Surgical History:  Procedure Laterality Date  . ABDOMINAL HYSTERECTOMY    . CHOLECYSTECTOMY    . TOTAL KNEE ARTHROPLASTY Right   . VARICOSE  VEIN SURGERY Left      Allergies  Allergen Reactions  . Penicillins Anaphylaxis  . Strawberry Extract Anaphylaxis  . Ace Inhibitors Other (See Comments)    Feeling Faint   . Pseudoephedrine Other (See Comments)    Makes heart race      Family History  Problem Relation Age of Onset  . Diabetes    . CAD       Social History Veronica Harrison reports that she quit smoking about 12 years ago. Her smoking use included Cigarettes. She started smoking about 47 years ago. She has a 34.00 pack-year smoking history. She has never used smokeless tobacco. Veronica Harrison reports that she does not drink alcohol.   Review of Systems CONSTITUTIONAL: No weight loss, fever, chills, weakness or fatigue.  HEENT: Eyes: No visual loss, blurred vision, double vision or yellow sclerae.No hearing loss, sneezing, congestion, runny nose or sore throat.  SKIN: No rash or itching.  CARDIOVASCULAR: per HPI RESPIRATORY: No shortness of breath, cough or sputum.  GASTROINTESTINAL: No anorexia, nausea, vomiting or diarrhea. No abdominal pain or blood.  GENITOURINARY: No burning on urination, no polyuria NEUROLOGICAL: No headache, dizziness, syncope, paralysis, ataxia, numbness or tingling in the extremities. No change in bowel or bladder control.  MUSCULOSKELETAL: No muscle, back pain, joint pain or stiffness.  LYMPHATICS: No enlarged nodes. No history of splenectomy.  PSYCHIATRIC: No history of depression or anxiety.  ENDOCRINOLOGIC: No reports of sweating, cold or heat intolerance. No polyuria or polydipsia.  Marland Kitchen   Physical Examination Vitals:   04/20/17 1411  BP: (!) 155/83  Pulse: 75   Vitals:   04/20/17 1411  Weight: 221 lb (100.2 kg)  Height:  (1.702 m)    Gen: resting comfortably, no acute distress HEENT: no scleral icterus, pupils equal round and reactive, no palptable cervical adenopathy,  CV: RRR, no m/r/g, no jvd Resp: +bilateral wheezing.  GI: abdomen is soft, non-tender,  non-distended, normal bowel sounds, no hepatosplenomegaly MSK: extremities are warm, no edema.  Skin: warm, no rash Neuro:  no focal deficits Psych: appropriate affect   Diagnostic Studies 07/2014 Echo Study Conclusions  - Left ventricle: The cavity size was normal. Systolic function was normal. The estimated ejection fraction was in the range of 60% to 65%. Wall motion was normal; there were no regional wall motion abnormalities. Features are consistent with a pseudonormal left ventricular filling pattern, with concomitant abnormal relaxation and increased filling pressure (grade 2 diastolic  dysfunction). Moderate posterior wall and mild septal hypertrophy. - Mitral valve: There was trivial regurgitation. - Left atrium: The atrium was mildly dilated.  06/2015 Holter monitor [facility]: short episodes of afib with RVR   01/29/16 Clinic EKG (performed and reviewed in clinic): normal sinus rhythm, normal QTc.      Assessment and Plan   1. Afib - maintaining sinus rhythm on sotalol, EKG in clinic shows SR normal QTc - CHADS2Vasc score of 3(gender, age, DM2),  continue eliquis.    2. HTN - at goal based on home numbers, elevated in clinic - continue current meds   F/u 6 months. Request pcp labs     Antoine Poche, M.D.

## 2017-04-20 NOTE — Patient Instructions (Addendum)
Your physician wants you to follow-up in: 6 MONTHS WITH DR [facility] You will receive a reminder letter in the mail two months in advance. If you don't receive a letter, please call our office to schedule the follow-up appointment.  Your physician has recommended you make the following change in your medication:   TAKE LASIX 20 MG DAILY FOR 3 DAYS THEN RESUME AS NEEDED  Thank you for choosing [facility]!!

## 2017-05-05 ENCOUNTER — Ambulatory Visit: Payer: Medicare Other | Admitting: Sports Medicine

## 2017-05-08 ENCOUNTER — Other Ambulatory Visit: Payer: Self-pay | Admitting: *Deleted

## 2017-05-08 MED ORDER — APIXABAN 5 MG PO TABS: 5.0000 mg | ORAL_TABLET | Freq: Two times a day (BID) | ORAL | 0 refills | Status: DC

## 2017-05-15 ENCOUNTER — Other Ambulatory Visit: Payer: Self-pay | Admitting: Cardiology

## 2017-05-21 ENCOUNTER — Ambulatory Visit
Admission: RE | Admit: 2017-05-21 | Discharge: 2017-05-21 | Disposition: A | Discharge status: Home / Self Care | Payer: Medicare Other | Source: Ambulatory Visit | Attending: Otolaryngology | Admitting: Otolaryngology

## 2017-05-21 ENCOUNTER — Other Ambulatory Visit: Payer: Self-pay | Admitting: Otolaryngology

## 2017-05-21 DIAGNOSIS — J320 Chronic maxillary sinusitis: Secondary | ICD-10-CM

## 2017-05-26 ENCOUNTER — Ambulatory Visit (INDEPENDENT_AMBULATORY_CARE_PROVIDER_SITE_OTHER): Payer: Medicare Other | Admitting: Sports Medicine

## 2017-05-26 ENCOUNTER — Encounter: Payer: Self-pay | Admitting: Sports Medicine

## 2017-05-26 DIAGNOSIS — L853 Xerosis cutis: Secondary | ICD-10-CM

## 2017-05-26 DIAGNOSIS — B351 Tinea unguium: Secondary | ICD-10-CM | POA: Diagnosis not present

## 2017-05-26 DIAGNOSIS — E1142 Type 2 diabetes mellitus with diabetic polyneuropathy: Secondary | ICD-10-CM | POA: Diagnosis not present

## 2017-05-26 NOTE — Progress Notes (Addendum)
Subjective: Veronica Harrison is a 66 y.o. female patient with history of diabetes who returns to office today complaining of long, painful nails while ambulating in shoes; unable to trim. Patient states that the glucose reading this morning was not recorded but is back in normal range after finishing steroids from being sick. Denies any other issues.   Patient Active Problem List   Diagnosis Date Noted  . 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 A-fib ([place]) 03/10/2015  . HTN (hypertension) 03/10/2015  . DM2 (diabetes mellitus, type 2) ([place]) 03/10/2015   Current Outpatient Prescriptions on File Prior to Visit  Medication Sig Dispense Refill  . acetaminophen (TYLENOL) 500 MG tablet Take 500 mg by mouth 2 (two) times daily as needed.    Marland Kitchen albuterol (PROVENTIL) (2.5 MG/3ML) 0.083% nebulizer solution Take 2.5 mg by nebulization every 6 (six) hours as needed for wheezing or shortness of breath.    Marland Kitchen apixaban (ELIQUIS) 5 MG TABS tablet Take 1 tablet (5 mg total) by mouth 2 (two) times daily. 42 tablet 0  . bisacodyl (BISACODYL) 5 MG EC tablet Take 5 mg by mouth 2 (two) times daily as needed for moderate constipation.    . cefUROXime (CEFTIN) 250 MG tablet     . clidinium-chlordiazePOXIDE (LIBRAX) 5-2.5 MG per capsule Take 1 capsule by mouth daily.     . clindamycin (CLEOCIN) 150 MG capsule Take 600 mg by mouth as directed. Prior to dentist appointments    . clonazePAM (KLONOPIN) 2 MG tablet Take 2 mg by mouth at bedtime.     Marland Kitchen diltiazem (CARDIZEM) 60 MG tablet TAKE (1) TABLET TWICE DAILY. 60 tablet 0  . diphenhydrAMINE (BENADRYL) 25 mg capsule qhs    . esomeprazole (NEXIUM) 40 MG capsule Take 40 mg by mouth daily as needed.     . Fluticasone-Salmeterol (ADVAIR) 250-50 MCG/DOSE AEPB Inhale 1 puff into the lungs as needed.    . folic acid (FOLVITE) 1 MG tablet Take 1 mg by mouth 2 (two) times daily.    . furosemide (LASIX) 20 MG tablet Take one tablet once a  day as needed for swelling or shortness of breath. 90 tablet 1  . glucose blood test strip 1 each by Other route as needed for other. Use as instructed    . ipratropium (ATROVENT HFA) 17 MCG/ACT inhaler Inhale 2 puffs into the lungs every 6 (six) hours.    . Lancets 28G MISC 28 g by Does not apply route daily.    Marland Kitchen levothyroxine (SYNTHROID, LEVOTHROID) 137 MCG tablet Take 137 mcg by mouth daily before breakfast.    . magnesium oxide (MAG-OX) 400 MG tablet Take 400 mg by mouth daily.     . metFORMIN (GLUCOPHAGE) 1000 MG tablet Take 1,000 mg by mouth 2 (two) times daily with a meal.    . Methotrexate, PF, 25 MG/0.4ML SOAJ Inject 15 mg into the skin once a week.    Marland Kitchen oxyCODONE (ROXICODONE) 15 MG immediate release tablet Take 15 mg by mouth as needed for pain. Take 1 1/2 tablet four times a day    . oxyCODONE (ROXICODONE) 15 MG immediate release tablet Take 15 mg by mouth 4 (four) times daily as needed.    . potassium chloride (KLOR-CON) 20 MEQ packet Take 20 mEq by mouth 2 (two) times daily.    . predniSONE (DELTASONE) 20 MG tablet 10 mg.    . promethazine (PHENERGAN) 25 MG tablet Take 25 mg by mouth as needed  for nausea or vomiting. Take 1 tablet four times a day as needed    . sotalol (BETAPACE) 120 MG tablet TAKE ONE TABLET TWICE DAILY. 60 tablet 0  . triamcinolone (NASACORT AQ) 55 MCG/ACT AERO nasal inhaler Place 1 spray into the nose daily as needed.     . Tuberculin-Allergy Syringes (B-D TB SYRINGE 1CC/22GX1") 22G X 1" 1 ML MISC by Does not apply route. Use as directed    . ustekinumab (STELARA) 90 MG/ML SOSY injection Inject 90 mg into the skin.    . VENTOLIN HFA 108 (90 Base) MCG/ACT inhaler      No current facility-administered medications on file prior to visit.    Allergies  Allergen Reactions  . Penicillins Anaphylaxis  . Strawberry Extract Anaphylaxis  . Ace Inhibitors Other (See Comments)    Feeling Faint   . Pseudoephedrine Other (See Comments)    Makes heart race    No  results found for this or any previous visit (from the past 2160 hour(s)).  Objective: General: Patient is awake, alert, and oriented x 3 and in no acute distress.  Integument: Skin is warm, dry and supple bilateral. Nails are tender, long, thickened and dystrophic with subungual debris, consistent with onychomycosis, 1-5 bilateral. Dry skin with mild fissuring plantar left foot. No signs of infection. No open lesions or preulcerative lesions present bilateral. Remaining integument unremarkable.  Vasculature:  Dorsalis Pedis pulse 1/4 bilateral. Posterior Tibial pulse  1/4 bilateral. Capillary fill time <3 sec 1-5 bilateral. Positive hair growth to the level of the digits. Temperature gradient within normal limits. No varicosities present bilateral. Focal edema on left ankle.   Neurology: The patient has intact sensation measured with a 5.07/10g Semmes Weinstein Monofilament at all pedal sites bilateral . Vibratory sensation diminished bilateral with tuning fork. No Babinski sign present bilateral.   Musculoskeletal: No symptomatic pedal deformities noted bilateral. Muscular strength 5/5 in all lower extremity muscular groups bilateral without pain on range of motion. No tenderness with calf compression bilateral.  Assessment and Plan: Problem List Items Addressed This Visit    None    Visit Diagnoses    Dermatophytosis of nail    -  Primary   Callus of foot       Diabetic polyneuropathy associated with type 2 diabetes mellitus ([place])          -Examined patient. -Discussed and educated patient on diabetic foot care, especially with regards to the vascular, neurological and musculoskeletal systems.  -Stressed the importance of good glycemic control and the detriment of not controlling glucose levels in relation to the foot. -Mechanically debrided nails 1-5 bilateral using sterile nail nipper and filed with dremel without incident  -Recommend [place] healthy feet for dry skin on  left -Continue with met pads bilateral to shoes -Continue with ankle compression sleeve and support on left -Answered all patient questions  -Patient to return in 62 days for at risk foot care -Patient advised to call the office if any problems or questions arise in the meantime.  Landis Martins, DPM

## 2017-06-10 DIAGNOSIS — S81019A Laceration without foreign body, unspecified knee, initial encounter: Secondary | ICD-10-CM | POA: Insufficient documentation

## 2017-06-11 ENCOUNTER — Other Ambulatory Visit: Payer: Self-pay

## 2017-06-11 MED ORDER — APIXABAN 5 MG PO TABS: 5.0000 mg | ORAL_TABLET | Freq: Two times a day (BID) | ORAL | 0 refills | Status: DC

## 2017-07-06 ENCOUNTER — Other Ambulatory Visit: Payer: Self-pay

## 2017-07-06 ENCOUNTER — Telehealth: Payer: Self-pay | Admitting: Cardiology

## 2017-07-06 MED ORDER — APIXABAN 5 MG PO TABS: 5.0000 mg | ORAL_TABLET | Freq: Two times a day (BID) | ORAL | 0 refills | Status: DC

## 2017-07-06 NOTE — Telephone Encounter (Signed)
Patient walk in  Wants to know if she will need authorization to have an injections from her Ortho doctor

## 2017-07-06 NOTE — Telephone Encounter (Signed)
Received fax from [facility] stating that the provider there had assured her that the Eliquis would not interfere with any knee injections & that she did not need to stop prior to her appointment.  Patient requested that [facility] send note to Dr. Wyline MoodBranch as well.  Note fwd to provider for clarification.

## 2017-07-07 NOTE — Telephone Encounter (Signed)
Im ok continuing eliquis for knee injection if her orthopedist is, they tend to know more about the bleeding risks for specific procedures.   J Mikhael Hendriks MD

## 2017-07-07 NOTE — Telephone Encounter (Signed)
Patient made aware and verbalized understanding.

## 2017-07-10 ENCOUNTER — Other Ambulatory Visit: Payer: Self-pay | Admitting: *Deleted

## 2017-07-10 MED ORDER — APIXABAN 5 MG PO TABS: 5.0000 mg | ORAL_TABLET | Freq: Two times a day (BID) | ORAL | 3 refills | Status: DC

## 2017-07-17 ENCOUNTER — Other Ambulatory Visit: Payer: Self-pay | Admitting: Cardiology

## 2017-07-17 MED ORDER — DILTIAZEM HCL 60 MG PO TABS: ORAL_TABLET | ORAL | 1 refills | Status: DC

## 2017-07-17 MED ORDER — SOTALOL HCL 120 MG PO TABS
120.0000 mg | ORAL_TABLET | Freq: Two times a day (BID) | ORAL | 1 refills | Status: DC
Start: 2017-07-17 — End: 2017-12-31

## 2017-07-21 DIAGNOSIS — S73015A Posterior dislocation of left hip, initial encounter: Secondary | ICD-10-CM | POA: Insufficient documentation

## 2017-07-21 DIAGNOSIS — S82841K Displaced bimalleolar fracture of right lower leg, subsequent encounter for closed fracture with nonunion: Secondary | ICD-10-CM | POA: Insufficient documentation

## 2017-07-21 DIAGNOSIS — S72052A Unspecified fracture of head of left femur, initial encounter for closed fracture: Secondary | ICD-10-CM | POA: Insufficient documentation

## 2017-07-22 DIAGNOSIS — I7100 Dissection of unspecified site of aorta: Secondary | ICD-10-CM | POA: Insufficient documentation

## 2017-07-22 DIAGNOSIS — I609 Nontraumatic subarachnoid hemorrhage, unspecified: Secondary | ICD-10-CM | POA: Insufficient documentation

## 2017-07-28 ENCOUNTER — Ambulatory Visit: Payer: Medicare Other | Admitting: Sports Medicine

## 2017-08-19 ENCOUNTER — Telehealth: Payer: Self-pay | Admitting: Cardiology

## 2017-08-19 NOTE — Telephone Encounter (Signed)
Patient is curently in [facility].  Wanted to let Dr Wyline Mood and Ge[facility] Berlin know that she has been told she will have a long recovery.  That if she is not able to make her appointment in October this is reason.  She gave number in case you had any questions.

## 2017-08-19 NOTE — Telephone Encounter (Signed)
Dr. Wyline Mood and [facility] notified.

## 2017-08-24 DIAGNOSIS — S35513A Injury of unspecified iliac artery, initial encounter: Secondary | ICD-10-CM | POA: Insufficient documentation

## 2017-09-30 DIAGNOSIS — M25562 Pain in left knee: Secondary | ICD-10-CM | POA: Insufficient documentation

## 2017-09-30 DIAGNOSIS — M25551 Pain in right hip: Secondary | ICD-10-CM | POA: Insufficient documentation

## 2017-11-03 ENCOUNTER — Telehealth: Payer: Self-pay | Admitting: Cardiology

## 2017-11-03 NOTE — Telephone Encounter (Signed)
Veronica MessierKathy called asking about medication.  She does not believe that they are giving her correct dose of medication

## 2017-11-03 NOTE — Telephone Encounter (Signed)
Left message for return call from [facility] - she will return call after her lunch break.

## 2017-11-05 NOTE — Telephone Encounter (Signed)
Attempted to return call - lengthy hold for nurse, while waiting call disconnected.  Will try again later.

## 2017-11-06 NOTE — Telephone Encounter (Signed)
Patient currently resides at [facility].

## 2017-12-31 ENCOUNTER — Other Ambulatory Visit: Payer: Self-pay | Admitting: Cardiology

## 2018-01-04 ENCOUNTER — Telehealth: Payer: Self-pay | Admitting: Cardiology

## 2018-01-04 MED ORDER — DILTIAZEM HCL 60 MG PO TABS: ORAL_TABLET | ORAL | 6 refills | Status: DC
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
## 2018-01-04 NOTE — Telephone Encounter (Signed)
Patient called to ask about medication being called in to be filled.   She said that she went to pick up her medication and [facility] told her it had already been[picked up>????

## 2018-01-15 ENCOUNTER — Ambulatory Visit: Payer: PRIVATE HEALTH INSURANCE | Admitting: Cardiology

## 2018-01-26 ENCOUNTER — Telehealth: Payer: Self-pay | Admitting: Cardiology

## 2018-01-26 NOTE — Telephone Encounter (Signed)
Patient has question about using equipment that is to use with her broken bone.    [phone]

## 2018-01-26 NOTE — Telephone Encounter (Signed)
LMTCB

## 2018-01-26 NOTE — Telephone Encounter (Signed)
Patient is using a bone simulator to help heal a broken bone. Patient would like to know if this will have any affect on her heart?

## 2018-01-26 NOTE — Telephone Encounter (Signed)
Ok to use from cardiac standpoint   Veronica FerryJ [name] MD

## 2018-01-27 NOTE — Telephone Encounter (Signed)
LMTCB

## 2018-01-28 NOTE — Telephone Encounter (Signed)
Spoke with pt husband and made aware. Says pt is scheduled [facility] hip [facility] tomorrow.

## 2018-01-29 HISTORY — PX: TOTAL HIP ARTHROPLASTY: SHX124

## 2018-01-31 DIAGNOSIS — Z7901 Long term (current) use of anticoagulants: Secondary | ICD-10-CM | POA: Insufficient documentation

## 2018-02-22 DIAGNOSIS — Z96642 Presence of left artificial hip joint: Secondary | ICD-10-CM | POA: Insufficient documentation

## 2018-02-23 ENCOUNTER — Ambulatory Visit: Payer: PRIVATE HEALTH INSURANCE | Admitting: Cardiology

## 2018-03-01 ENCOUNTER — Ambulatory Visit (INDEPENDENT_AMBULATORY_CARE_PROVIDER_SITE_OTHER): Payer: Medicare Other | Admitting: Cardiology

## 2018-03-01 ENCOUNTER — Encounter: Payer: Self-pay | Admitting: *Deleted

## 2018-03-01 VITALS — BP 122/68 | HR 71 | Ht 67.0 in | Wt 187.0 lb

## 2018-03-01 DIAGNOSIS — I10 Essential (primary) hypertension: Secondary | ICD-10-CM

## 2018-03-01 DIAGNOSIS — I4891 Unspecified atrial fibrillation: Secondary | ICD-10-CM

## 2018-03-01 MED ORDER — APIXABAN 5 MG PO TABS: 5.0000 mg | ORAL_TABLET | Freq: Two times a day (BID) | ORAL | 3 refills | Status: DC

## 2018-03-01 NOTE — Patient Instructions (Addendum)
Medication Instructions:  Your physician recommends that you continue on your current medications as directed. Please refer to the Current Medication list given to you today.   Labwork: NONE  Testing/Procedures: NONE  Follow-Up: Your physician wants you to follow-up in: 6 MONTHS .  You will receive a reminder letter in the mail two months in advance. If you don't receive a letter, please call our office to schedule the follow-up appointment.   Any Other Special Instructions Will Be Listed Below (If Applicable).   SAMPLES GIVEN  LOT # [id number] EX JUN 2021  If you need a refill on your cardiac medications before your next appointment, please call your pharmacy.
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
## 2018-03-06 ENCOUNTER — Other Ambulatory Visit: Payer: Self-pay | Admitting: Cardiology

## 2018-03-08 ENCOUNTER — Encounter: Payer: Self-pay | Admitting: Cardiology

## 2018-03-08 ENCOUNTER — Other Ambulatory Visit: Payer: Self-pay

## 2018-03-25 ENCOUNTER — Telehealth: Payer: Self-pay | Admitting: *Deleted

## 2018-03-25 ENCOUNTER — Other Ambulatory Visit: Payer: Self-pay | Admitting: *Deleted

## 2018-03-25 MED ORDER — APIXABAN 5 MG PO TABS: 5.0000 mg | ORAL_TABLET | Freq: Two times a day (BID) | ORAL | 0 refills | Status: DC

## 2018-03-25 MED ORDER — APIXABAN 5 MG PO TABS: 5.0000 mg | ORAL_TABLET | Freq: Two times a day (BID) | ORAL | 3 refills | Status: DC

## 2018-03-25 NOTE — Telephone Encounter (Signed)
Pt requesting Eliquis samples - made aware she could pick up and we were working on BMS pt assistant  application.

## 2018-04-13 ENCOUNTER — Ambulatory Visit: Payer: Medicare Other | Admitting: Sports Medicine

## 2018-04-20 ENCOUNTER — Ambulatory Visit (INDEPENDENT_AMBULATORY_CARE_PROVIDER_SITE_OTHER): Payer: Medicare Other | Admitting: Sports Medicine

## 2018-04-20 ENCOUNTER — Encounter: Payer: Self-pay | Admitting: Sports Medicine

## 2018-04-20 DIAGNOSIS — B351 Tinea unguium: Secondary | ICD-10-CM | POA: Diagnosis not present

## 2018-04-20 DIAGNOSIS — M79671 Pain in right foot: Secondary | ICD-10-CM | POA: Diagnosis not present

## 2018-04-20 DIAGNOSIS — Z8781 Personal history of (healed) traumatic fracture: Secondary | ICD-10-CM

## 2018-04-20 DIAGNOSIS — M79672 Pain in left foot: Secondary | ICD-10-CM | POA: Diagnosis not present

## 2018-04-20 DIAGNOSIS — E1142 Type 2 diabetes mellitus with diabetic polyneuropathy: Secondary | ICD-10-CM
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
## 2018-05-03 ENCOUNTER — Other Ambulatory Visit: Payer: Self-pay | Admitting: Cardiology

## 2018-05-07 ENCOUNTER — Other Ambulatory Visit: Payer: Self-pay | Admitting: *Deleted

## 2018-05-07 MED ORDER — APIXABAN 5 MG PO TABS: 5.0000 mg | ORAL_TABLET | Freq: Two times a day (BID) | ORAL | 0 refills | Status: DC

## 2018-05-17 DIAGNOSIS — R7989 Other specified abnormal findings of blood chemistry: Secondary | ICD-10-CM | POA: Insufficient documentation

## 2018-05-17 DIAGNOSIS — L4050 Arthropathic psoriasis, unspecified: Secondary | ICD-10-CM | POA: Insufficient documentation

## 2018-05-17 DIAGNOSIS — D649 Anemia, unspecified: Secondary | ICD-10-CM | POA: Insufficient documentation

## 2018-05-19 ENCOUNTER — Encounter: Payer: Self-pay | Admitting: Physician Assistant

## 2018-05-19 NOTE — Progress Notes (Signed)
Hi Veronica Harrison, I am one of the PAs covering Dr. Verna Czech box while he is out this week. From our records it would appear she is only on low dose diltiazem  BID. If this is still the case, I agree with your plan of cutting diltiazem in half - please ask her to continue to monitor her HR. If it remains on the lower side or she notices any new dizziness to correlate, stop the diltiazem completely and call our office. Thanks, Veronica Harrison ===View-only below this line===   ----- Message ----- From: Veronica Harrison [facility]) Sent: 05/17/2018   2:59 PM To: Veronica Poche, MD [facility]) Subject: mutual patient  Hi Dr. Wyline Harrison,  I saw [name] today in our preop clinic in preparation for an ankle biopsy.  Her HR today in clinic ranged from 45-55.  She feels that this is new for her.  She has occasional dizziness, sounds like orthostasis, although her orthostatic VS were negative today.  She has lost 40 pounds in the last 4 months, and feels as though her medications need to be adjusted.  She is taking diltiazem and sotalol.  She wanted to cut her diltiazem in half until we heard from you.  I told her that was fine and that I would message you.  I made her an appointment to see you on June 4th, however this is after her surgery.  I am checking basic lab work and TFTs.  Any other recommendations you have for her?  Thanks, Veronica Maxim, NP

## 2018-05-25 ENCOUNTER — Telehealth: Payer: Self-pay | Admitting: Cardiology

## 2018-05-25 NOTE — Telephone Encounter (Signed)
Patient stated that she is having bone biopsy.  Doing the Diltiazem  twice a day.  Wants to know if she can skip medication for that morning, will be driving 2 hours to [facility] for this procedure and is concerned about her heart rate dropping too low.  Also, questions the Sotalol & taking that medication the morning of.  Supposed to go home same day.

## 2018-05-25 NOTE — Telephone Encounter (Signed)
Patient has questions and concerns about not taking Diltiazem for the day she is scheduled for procedure/surgery

## 2018-05-25 NOTE — Telephone Encounter (Signed)
Per conversation with Dr Wyline Mood and previous 5/22 phone note, pt will cut back on the dilt 30 mg bid and hold on Friday before her procedure. Eliquis instructions have been adressed with pt from nurse at [facility]. Updated medication list - pt has appt 6/2

## 2018-05-25 NOTE — Telephone Encounter (Signed)
I would take both the sotalol and dilitazem. To my knowledge her heart rates have done well on these medicines, there is no reason why bone biopsy would lower her heart rates, if anything may increase heart rates. Has she noticed any low heart rates at home? Also Im sure they made arrangements to hold her blood thinner but verify with her   J Reynaldo Rossman MD

## 2018-05-31 ENCOUNTER — Other Ambulatory Visit: Payer: Self-pay | Admitting: Cardiology

## 2018-06-01 ENCOUNTER — Ambulatory Visit (INDEPENDENT_AMBULATORY_CARE_PROVIDER_SITE_OTHER): Payer: Medicare Other | Admitting: Cardiology

## 2018-06-01 ENCOUNTER — Encounter: Payer: Self-pay | Admitting: Cardiology

## 2018-06-01 VITALS — BP 145/73 | HR 58 | Ht 67.0 in | Wt 178.2 lb

## 2018-06-01 DIAGNOSIS — I10 Essential (primary) hypertension: Secondary | ICD-10-CM | POA: Diagnosis not present

## 2018-06-01 DIAGNOSIS — I4891 Unspecified atrial fibrillation: Secondary | ICD-10-CM | POA: Diagnosis not present

## 2018-06-01 MED ORDER — DILTIAZEM HCL 30 MG PO TABS: ORAL_TABLET | ORAL | 1 refills | Status: DC

## 2018-06-01 MED ORDER — APIXABAN 5 MG PO TABS: 5.0000 mg | ORAL_TABLET | Freq: Two times a day (BID) | ORAL | 0 refills | Status: DC

## 2018-06-01 NOTE — Patient Instructions (Signed)
Your physician recommends that you schedule a follow-up appointment in: [facility] MON[facility]  Your physician has recommended you make the following change in your medication:   DECREASE DILTIAZEM 30 MG TWICE DAILY MAY TAKE ADDITIONAL 30 MG AS NEEDED FOR PALPITATIONS OR FOR SYSTOLIC BLOOD PRESSURE GREATER THAN 180  PLEASE CALL OUR OFFICE WITH HEART LOWER THAN 50 OR SYSTOLIC BLOOD PRESSURE GREATER THAN 180.   Thank you for choosing [facility]!!

## 2018-06-01 NOTE — Progress Notes (Addendum)
Clinical Summary Ms. Sakuma is a 67 y.o.female seen today for follow up of the following medical problems. This is a focused visit on her history of afib and recent bradycardia.    1. Afib  - failed rate control strategy. Started on sotalol in EP clinicand has done well.QTc has remained normal on routine checks.  - recent low heart rates. Dilt lowered to 30mg  bid - negative orthstatics at outside provider visit.  - recent 40 lbs weight loss.  - no recent palpitations - compliant with meds      Past Medical History:  Diagnosis Date  . Atrial fibrillation (HCC)   . Chronic pain   . Graves' disease   . Hypertension   . Psoriatic arthritis (HCC)   . Type 2 diabetes mellitus (HCC)   . Vitamin D deficiency      Allergies  Allergen Reactions  . Penicillins Anaphylaxis  . Strawberry Extract Anaphylaxis  . Ace Inhibitors Other (See Comments)    Feeling Faint   . Pseudoephedrine Other (See Comments)    Makes heart race     Current Outpatient Medications  Medication Sig Dispense Refill  . acetaminophen (TYLENOL) 500 MG tablet Take 500 mg by mouth 2 (two) times daily as needed.    Marland Kitchen albuterol (PROVENTIL) (2.5 MG/3ML) 0.083% nebulizer solution Take 2.5 mg by nebulization every 6 (six) hours as needed for wheezing or shortness of breath.    Marland Kitchen apixaban (ELIQUIS) 5 MG TABS tablet Take 1 tablet (5 mg total) by mouth 2 (two) times daily. 180 tablet 3  . apixaban (ELIQUIS) 5 MG TABS tablet Take 1 tablet (5 mg total) by mouth 2 (two) times daily. 28 tablet 0  . bisacodyl (BISACODYL) 5 MG EC tablet Take 5 mg by mouth 2 (two) times daily as needed for moderate constipation.    . cefUROXime (CEFTIN) 250 MG tablet     . clidinium-chlordiazePOXIDE (LIBRAX) 5-2.5 MG per capsule Take 1 capsule by mouth daily.     . clindamycin (CLEOCIN) 150 MG capsule Take 600 mg by mouth as directed. Prior to dentist appointments    . clonazePAM (KLONOPIN) 2 MG tablet Take 2 mg by mouth at  bedtime.     Marland Kitchen diltiazem (CARDIZEM) 30 MG tablet Take 30 mg by mouth 2 (two) times daily.    . diphenhydrAMINE (BENADRYL) 25 mg capsule qhs    . esomeprazole (NEXIUM) 40 MG capsule Take 40 mg by mouth daily as needed.     . Fluticasone-Salmeterol (ADVAIR) 250-50 MCG/DOSE AEPB Inhale 1 puff into the lungs as needed.    . folic acid (FOLVITE) 1 MG tablet Take 1 mg by mouth 2 (two) times daily.    . furosemide (LASIX) 20 MG tablet Take one tablet once a day as needed for swelling or shortness of breath. 90 tablet 1  . glucose blood test strip 1 each by Other route as needed for other. Use as instructed    . ipratropium (ATROVENT HFA) 17 MCG/ACT inhaler Inhale 2 puffs into the lungs every 6 (six) hours.    . Lancets 28G MISC 28 g by Does not apply route daily.    Marland Kitchen levothyroxine (SYNTHROID, LEVOTHROID) 137 MCG tablet Take 137 mcg by mouth daily before breakfast.    . magnesium oxide (MAG-OX) 400 MG tablet Take 400 mg by mouth daily.     . metFORMIN (GLUCOPHAGE) 1000 MG tablet Take 1,000 mg by mouth 2 (two) times daily with a meal.    .  Methotrexate, PF, 25 MG/0.4ML SOAJ Inject 15 mg into the skin once a week.    Marland Kitchen oxyCODONE (ROXICODONE) 15 MG immediate release tablet Take 15 mg by mouth as needed for pain. Take 1 1/2 tablet four times a day    . oxyCODONE (ROXICODONE) 15 MG immediate release tablet Take 15 mg by mouth 4 (four) times daily as needed.    . potassium chloride (KLOR-CON) 20 MEQ packet Take 20 mEq by mouth 2 (two) times daily.    . predniSONE (DELTASONE) 20 MG tablet 10 mg.    . promethazine (PHENERGAN) 25 MG tablet Take 25 mg by mouth as needed for nausea or vomiting. Take 1 tablet four times a day as needed    . sotalol (BETAPACE) 120 MG tablet TAKE (1) TABLET TWICE DAILY. 60 tablet 3  . sotalol (BETAPACE) 120 MG tablet TAKE (1) TABLET TWICE DAILY. 60 tablet 6  . sotalol (BETAPACE) 120 MG tablet TAKE (1) TABLET TWICE DAILY. 60 tablet 0  . sotalol (BETAPACE) 120 MG tablet TAKE (1)  TABLET TWICE DAILY. 60 tablet 6  . triamcinolone (NASACORT AQ) 55 MCG/ACT AERO nasal inhaler Place 1 spray into the nose daily as needed.     . Tuberculin-Allergy Syringes (B-D TB SYRINGE 1CC/22GX1") 22G X 1" 1 ML MISC by Does not apply route. Use as directed    . ustekinumab (STELARA) 90 MG/ML SOSY injection Inject 90 mg into the skin.    . VENTOLIN HFA 108 (90 Base) MCG/ACT inhaler      No current facility-administered medications for this visit.      Past Surgical History:  Procedure Laterality Date  . ABDOMINAL HYSTERECTOMY    . CHOLECYSTECTOMY    . TOTAL HIP ARTHROPLASTY  01/29/2018  . TOTAL KNEE ARTHROPLASTY Right   . VARICOSE VEIN SURGERY Left      Allergies  Allergen Reactions  . Penicillins Anaphylaxis  . Strawberry Extract Anaphylaxis  . Ace Inhibitors Other (See Comments)    Feeling Faint   . Pseudoephedrine Other (See Comments)    Makes heart race      Family History  Problem Relation Age of Onset  . Diabetes Unknown   . CAD Unknown      Social History Ms. Wimmer reports that she quit smoking about 13 years ago. Her smoking use included cigarettes. She started smoking about 48 years ago. She has a 34.00 pack-year smoking history. She has never used smokeless tobacco. Ms. Coulston reports that she does not drink alcohol.   Review of Systems CONSTITUTIONAL: No weight loss, fever, chills, weakness or fatigue.  HEENT: Eyes: No visual loss, blurred vision, double vision or yellow sclerae.No hearing loss, sneezing, congestion, runny nose or sore throat.  SKIN: No rash or itching.  CARDIOVASCULAR: no chest pain, no palpitations.  RESPIRATORY: No shortness of breath, cough or sputum.  GASTROINTESTINAL: No anorexia, nausea, vomiting or diarrhea. No abdominal pain or blood.  GENITOURINARY: No burning on urination, no polyuria NEUROLOGICAL: No headache, dizziness, syncope, paralysis, ataxia, numbness or tingling in the extremities. No change in bowel or bladder  control.  MUSCULOSKELETAL: No muscle, back pain, joint pain or stiffness.  LYMPHATICS: No enlarged nodes. No history of splenectomy.  PSYCHIATRIC: No history of depression or anxiety.  ENDOCRINOLOGIC: No reports of sweating, cold or heat intolerance. No polyuria or polydipsia.  Marland Kitchen   Physical Examination Vitals:   06/01/18 1043 06/01/18 1050  BP: 129/60 (!) 145/73  Pulse: (!) 57 (!) 58  SpO2: 97%  Vitals:   06/01/18 1043  Weight: 178 lb 3.2 oz (80.8 kg)  Height: 5\' 7"  (1.702 m)    Gen: resting comfortably, no acute distress HEENT: no scleral icterus, pupils equal round and reactive, no palptable cervical adenopathy,  CV: RRR, no mr/g, no jvd Resp: Clear to auscultation bilaterally GI: abdomen is soft, non-tender, non-distended, normal bowel sounds, no hepatosplenomegaly MSK: extremities are warm, no edema.  Skin: warm, no rash Neuro:  no focal deficits Psych: appropriate affect   Diagnostic Studies  07/2014 Echo Study Conclusions  - Left ventricle: The cavity size was normal. Systolic function was normal. The estimated ejection fraction was in the range of 60% to 65%. Wall motion was normal; there were no regional wall motion abnormalities. Features are consistent with a pseudonormal left ventricular filling pattern, with concomitant abnormal relaxation and increased filling pressure (grade 2 diastolic dysfunction). Moderate posterior wall and mild septal hypertrophy. - Mitral valve: There was trivial regurgitation. - Left atrium: The atrium was mildly dilated.  06/2015 Holter monitor [facility]: short episodes of afib with RVR   01/29/16 Clinic EKG (performed and reviewed in clinic): normal sinus rhythm, normal QTc.     Assessment and Plan  1. Afib - CHADS2Vasc score of 3(gender, age, DM2), continue eliquis.  - maintaining SR on sotalol. Recent bradycardia to 40s. I suspect related to strong med response due to 40 lbs weight loss - EKG  today shows sinus brady at 52 - change dilt to 30mg  bid  2. HTN - mildly elevated today, may increase with lowered dilt dosing - she will continue to monitor at home. She is allergic to ACE-I (dizzienss), does not want diuretic due to issues with urinary rentention. Would consider low dose ARB if additional therapy needed.         Antoine PocheJonathan F. Branch, M.D.

## 2018-06-06 ENCOUNTER — Encounter: Payer: Self-pay | Admitting: Cardiology

## 2018-06-10 ENCOUNTER — Other Ambulatory Visit: Payer: Self-pay | Admitting: *Deleted

## 2018-06-10 MED ORDER — DILTIAZEM HCL 30 MG PO TABS
ORAL_TABLET | ORAL | 3 refills | Status: DC
Start: 2018-06-10 — End: 2018-08-03

## 2018-07-13 ENCOUNTER — Telehealth: Payer: Self-pay | Admitting: Cardiology

## 2018-07-13 MED ORDER — APIXABAN 5 MG PO TABS: 5.0000 mg | ORAL_TABLET | Freq: Two times a day (BID) | ORAL | 0 refills | Status: DC

## 2018-07-13 NOTE — Telephone Encounter (Signed)
Patient needs Eliquis samples    Patient also would like to discuss her BP.  It has been staying at 150 and above.   Would like to know if she can increase diltiazem (CARDIZEM) 30 MG tablet

## 2018-07-13 NOTE — Telephone Encounter (Signed)
Pt says BP has been staying up SBP  >150-170s - pt will take additional diltiazem as per last phone note - HR remains in the 70s - says she is under a lot of stress with medical bill and upcoming foot surgery - will continue to monitor and take additional dilt as needed for BP - will also provide Eliquis samples - pt will pick up later this afternoon

## 2018-07-23 DIAGNOSIS — M1712 Unilateral primary osteoarthritis, left knee: Secondary | ICD-10-CM | POA: Insufficient documentation

## 2018-07-30 ENCOUNTER — Telehealth: Payer: Self-pay | Admitting: Cardiology

## 2018-07-30 NOTE — Telephone Encounter (Signed)
Patient still having issues with her BP and having to take more medication.

## 2018-08-02 ENCOUNTER — Ambulatory Visit (INDEPENDENT_AMBULATORY_CARE_PROVIDER_SITE_OTHER): Payer: Medicare Other | Admitting: Sports Medicine

## 2018-08-02 ENCOUNTER — Encounter: Payer: Self-pay | Admitting: Sports Medicine

## 2018-08-02 DIAGNOSIS — M79672 Pain in left foot: Secondary | ICD-10-CM

## 2018-08-02 DIAGNOSIS — Z8781 Personal history of (healed) traumatic fracture: Secondary | ICD-10-CM

## 2018-08-02 DIAGNOSIS — B351 Tinea unguium: Secondary | ICD-10-CM

## 2018-08-02 DIAGNOSIS — M79671 Pain in right foot: Secondary | ICD-10-CM

## 2018-08-02 DIAGNOSIS — L84 Corns and callosities: Secondary | ICD-10-CM

## 2018-08-02 DIAGNOSIS — E1142 Type 2 diabetes mellitus with diabetic polyneuropathy: Secondary | ICD-10-CM

## 2018-08-02 NOTE — Telephone Encounter (Signed)
Pt says SBP has been staying 150s -180s   HR is staying in 60s - has been having chronic pain with her ankle - says she is taking extra diltiazem 60 mg and wanted to know if she should continue with the extra dilt 60 mg daily for BP - says HR is staying in the 60s and didn't want to start any new medications with upcoming ankle surgery this month - denies any symptoms other than her ankle pain

## 2018-08-02 NOTE — Telephone Encounter (Signed)
On the days she takes her extra diltiazem what is the blood pressure and heart rates after taking the extra dose   Dominga FerryJ Amaan Meyer MD

## 2018-08-02 NOTE — Progress Notes (Signed)
Subjective: Veronica Harrison is a 67 y.o. female patient with history of diabetes who returns to office today complaining of long, painful nails while ambulating in shoes; unable to trim. Patient states that the glucose reading this morning was not recorded. States that she is still going to [facility] for R ankle and has on a skin graft. States that her blood pressure medication has been changed. Denies any other issues.   Patient Active Problem List   Diagnosis Date Noted  . 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 A-fib ([place]) 03/10/2015  . HTN (hypertension) 03/10/2015  . DM2 (diabetes mellitus, type 2) ([place]) 03/10/2015   Current Outpatient Medications on File Prior to Visit  Medication Sig Dispense Refill  . acetaminophen (TYLENOL) 500 MG tablet Take 500 mg by mouth 2 (two) times daily as needed.    Marland Kitchen albuterol (PROVENTIL) (2.5 MG/3ML) 0.083% nebulizer solution Take 2.5 mg by nebulization every 6 (six) hours as needed for wheezing or shortness of breath.    Marland Kitchen apixaban (ELIQUIS) 5 MG TABS tablet Take 1 tablet (5 mg total) by mouth 2 (two) times daily. 42 tablet 0  . bisacodyl (BISACODYL) 5 MG EC tablet Take 5 mg by mouth 2 (two) times daily as needed for moderate constipation.    . clidinium-chlordiazePOXIDE (LIBRAX) 5-2.5 MG per capsule Take 1 capsule by mouth daily.     . clindamycin (CLEOCIN) 150 MG capsule Take 600 mg by mouth as directed. Prior to dentist appointments    . clonazePAM (KLONOPIN) 2 MG tablet Take 2 mg by mouth at bedtime.     Marland Kitchen diltiazem (CARDIZEM) 30 MG tablet TAKE 1 TABLET TWICE DAILY - MAY TAKE ADDITIONAL 30 MG PRN FOR PALPITATIONS OR SBP >150 180 tablet 3  . diphenhydrAMINE (BENADRYL) 25 mg capsule qhs    . esomeprazole (NEXIUM) 40 MG capsule Take 40 mg by mouth daily as needed.     . Fluticasone-Salmeterol (ADVAIR) 250-50 MCG/DOSE AEPB Inhale 1 puff into the lungs as needed.    . folic acid (FOLVITE) 1 MG tablet Take 1 mg by mouth  2 (two) times daily.    . furosemide (LASIX) 20 MG tablet Take one tablet once a day as needed for swelling or shortness of breath. 90 tablet 1  . glucose blood test strip 1 each by Other route as needed for other. Use as instructed    . ipratropium (ATROVENT HFA) 17 MCG/ACT inhaler Inhale 2 puffs into the lungs every 6 (six) hours.    . Lancets 28G MISC 28 g by Does not apply route daily.    Marland Kitchen levothyroxine (SYNTHROID, LEVOTHROID) 137 MCG tablet Take 137 mcg by mouth daily before breakfast.    . magnesium oxide (MAG-OX) 400 MG tablet Take 400 mg by mouth daily.     . metFORMIN (GLUCOPHAGE) 1000 MG tablet Take 1,000 mg by mouth 2 (two) times daily with a meal.    . Methotrexate, PF, 25 MG/0.4ML SOAJ Inject 15 mg into the skin once a week.    Marland Kitchen oxyCODONE (ROXICODONE) 15 MG immediate release tablet Take 15-30 mg by mouth 4 (four) times daily as needed.     . potassium chloride (KLOR-CON) 20 MEQ packet Take 20 mEq by mouth 2 (two) times daily.    . promethazine (PHENERGAN) 25 MG tablet Take 25 mg by mouth as needed for nausea or vomiting. Take 1 tablet four times a day as needed    . sotalol (BETAPACE) 120 MG tablet  TAKE (1) TABLET TWICE DAILY. 60 tablet 3  . triamcinolone (NASACORT AQ) 55 MCG/ACT AERO nasal inhaler Place 1 spray into the nose daily as needed.     . ustekinumab (STELARA) 90 MG/ML SOSY injection Inject 90 mg into the skin.    . VENTOLIN HFA 108 (90 Base) MCG/ACT inhaler      No current facility-administered medications on file prior to visit.    Allergies  Allergen Reactions  . Penicillins Anaphylaxis  . Strawberry Extract Anaphylaxis  . Ace Inhibitors Other (See Comments)    Feeling Faint   . Gabapentin     Urinary incontinence   . Pseudoephedrine Other (See Comments)    Makes heart race    No results found for this or any previous visit (from the past 2160 hour(s)).  Objective: General: Patient is awake, alert, and oriented x 3 and in no acute distress.  Integument:  Skin is warm, dry and supple bilateral. Nails are tender, long, thickened and dystrophic with subungual debris, consistent with onychomycosis, 1-5 bilateral. Multiple scars on legs and ace wrap on right ankle. No signs of infection. Callus sub met 5 on left. Remaining integument unremarkable.  Vasculature:  Dorsalis Pedis pulse 1/4 bilateral. Posterior Tibial pulse  0/4 bilateral due to trace edema. Capillary fill time <5 sec 1-5 bilateral. Scant hair growth to the level of the digits.Temperature gradient within normal limits. ++ varicosities present bilateral.  Neurology: The patient has intact sensation measured with a 5.07/10g Semmes Weinstein Monofilament at all pedal sites bilateral . Vibratory sensation diminished bilateral with tuning fork. No Babinski sign present bilateral.   Musculoskeletal: Chronic pain at right ankle. No tenderness with calf compression bilateral.  Assessment and Plan: Problem List Items Addressed This Visit    None    Visit Diagnoses    Dermatophytosis of nail    -  Primary   Diabetic polyneuropathy associated with type 2 diabetes mellitus (HCC)       Bilateral foot pain       History of fracture of right ankle       Callus of foot          -Examined patient. -Discussed and educated patient on diabetic foot care, especially with regards to the vascular, neurological and musculoskeletal systems.  -Stressed the importance of good glycemic control and the detriment of not controlling glucose levels in relation to the foot. -Mechanically debrided nails 1-5 bilateral using sterile nail nipper and filed with dremel without incident  -Continue with Ortho follow up; R ankle surgery is scheduled for 08-11-18 -Answered all patient questions  -Patient to return in 3 months for nail care  -Patient advised to call the office if any problems or questions arise in the meantime.  Landis Martins, DPM

## 2018-08-03 MED ORDER — APIXABAN 5 MG PO TABS: 5.0000 mg | ORAL_TABLET | Freq: Two times a day (BID) | ORAL | 0 refills | Status: DC

## 2018-08-03 MED ORDER — DILTIAZEM HCL 30 MG PO TABS: ORAL_TABLET | ORAL | 3 refills | Status: DC

## 2018-08-03 NOTE — Telephone Encounter (Signed)
Can change dilt to 30mg  tid, may take additional 30mg  as needed.   Dominga FerryJ Rayann Jolley MD

## 2018-08-03 NOTE — Telephone Encounter (Signed)
Pt says after she takes additional dilt BP ranges from 130s-140s/60s HR stays around 60s - pt is taking additional pain meds (oxycodone 15-30 mg up to 4 times daily ) for ankle pain and is scheduled for surgery 8/14 - requesting additional dilt for BP control - thinks the pain is causing SBP to elevate and hopes this will be under better control - will sent RF on dilt and forward to provider

## 2018-08-03 NOTE — Telephone Encounter (Signed)
Pt aware and updated medication list

## 2018-08-03 NOTE — Addendum Note (Signed)
Addended by: Burman NievesASHWORTH, Deavion Strider T on: 08/03/2018 03:42 PM   Modules accepted: Orders

## 2018-08-06 DIAGNOSIS — E0500 Thyrotoxicosis with diffuse goiter without thyrotoxic crisis or storm: Secondary | ICD-10-CM | POA: Insufficient documentation

## 2018-08-06 DIAGNOSIS — F1190 Opioid use, unspecified, uncomplicated: Secondary | ICD-10-CM | POA: Insufficient documentation

## 2018-09-21 ENCOUNTER — Telehealth: Payer: Self-pay | Admitting: Cardiology

## 2018-09-21 MED ORDER — BLOOD PRESSURE KIT: 1.0000 | PACK | Freq: Every day | 0 refills | Status: AC

## 2018-09-21 MED ORDER — PULSE OXIMETER FOR FINGER MISC: 1.0000 | Freq: Every day | 0 refills | Status: AC

## 2018-09-21 NOTE — Telephone Encounter (Signed)
Patient needs to take BP and needs to know when she should take medication    Samples needed eliquis    And Rx for pulse oximeter

## 2018-09-21 NOTE — Telephone Encounter (Signed)
Pt requested blood pressure monitor for home with pulse ox as well - says if we send it in Medicare would pay for it - also samples are available and pt will come by today or tomorrow for pick up

## 2018-10-01 ENCOUNTER — Encounter: Payer: Self-pay | Admitting: *Deleted

## 2018-10-04 ENCOUNTER — Ambulatory Visit: Payer: Medicare Other | Admitting: Cardiology
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
## 2018-10-05 ENCOUNTER — Encounter: Payer: Self-pay | Admitting: Cardiology

## 2018-10-27 ENCOUNTER — Other Ambulatory Visit: Payer: Self-pay | Admitting: Cardiology

## 2018-11-01 ENCOUNTER — Encounter: Payer: Self-pay | Admitting: Podiatry

## 2018-11-01 ENCOUNTER — Ambulatory Visit (INDEPENDENT_AMBULATORY_CARE_PROVIDER_SITE_OTHER): Payer: Medicare Other | Admitting: Podiatry

## 2018-11-01 DIAGNOSIS — B351 Tinea unguium: Secondary | ICD-10-CM | POA: Diagnosis not present

## 2018-11-01 DIAGNOSIS — M79674 Pain in right toe(s): Secondary | ICD-10-CM | POA: Diagnosis not present

## 2018-11-01 DIAGNOSIS — M79675 Pain in left toe(s): Secondary | ICD-10-CM | POA: Diagnosis not present

## 2018-11-01 DIAGNOSIS — E1142 Type 2 diabetes mellitus with diabetic polyneuropathy: Secondary | ICD-10-CM

## 2018-11-01 NOTE — Patient Instructions (Signed)

## 2018-11-02 ENCOUNTER — Encounter: Payer: Self-pay | Admitting: *Deleted
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
## 2018-11-03 ENCOUNTER — Ambulatory Visit (INDEPENDENT_AMBULATORY_CARE_PROVIDER_SITE_OTHER): Payer: Medicare Other | Admitting: Cardiology

## 2018-11-03 ENCOUNTER — Encounter: Payer: Self-pay | Admitting: Cardiology

## 2018-11-03 ENCOUNTER — Encounter: Payer: Self-pay | Admitting: *Deleted

## 2018-11-03 VITALS — BP 164/75 | HR 54 | Ht 67.0 in | Wt 180.0 lb

## 2018-11-03 DIAGNOSIS — I4891 Unspecified atrial fibrillation: Secondary | ICD-10-CM | POA: Diagnosis not present

## 2018-11-03 DIAGNOSIS — I10 Essential (primary) hypertension: Secondary | ICD-10-CM

## 2018-11-03 MED ORDER — APIXABAN 5 MG PO TABS: ORAL_TABLET | ORAL | 0 refills | Status: DC
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
## 2018-11-03 NOTE — Patient Instructions (Signed)

## 2019-01-31 ENCOUNTER — Ambulatory Visit: Payer: Medicare Other | Admitting: Podiatry

## 2019-02-01 ENCOUNTER — Telehealth: Payer: Self-pay | Admitting: Cardiology

## 2019-02-01 MED ORDER — APIXABAN 5 MG PO TABS: ORAL_TABLET | ORAL | 0 refills | Status: DC

## 2019-02-01 NOTE — Telephone Encounter (Signed)
Patient calling the office for samples of medication:   1.  What medication and dosage are you requesting samples for?  eliquis   2.  Are you currently out of this medication? No states that she cannot afford

## 2019-02-01 NOTE — Telephone Encounter (Signed)
Pt will come by tomorrow for samples and pt assistance application (was denied last year)

## 2019-03-18 ENCOUNTER — Ambulatory Visit (INDEPENDENT_AMBULATORY_CARE_PROVIDER_SITE_OTHER): Payer: Medicare Other | Admitting: Podiatry

## 2019-03-18 ENCOUNTER — Encounter: Payer: Self-pay | Admitting: Podiatry

## 2019-03-18 ENCOUNTER — Other Ambulatory Visit: Payer: Self-pay

## 2019-03-18 DIAGNOSIS — E1142 Type 2 diabetes mellitus with diabetic polyneuropathy: Secondary | ICD-10-CM

## 2019-03-18 DIAGNOSIS — M79674 Pain in right toe(s): Secondary | ICD-10-CM | POA: Diagnosis not present

## 2019-03-18 DIAGNOSIS — D689 Coagulation defect, unspecified: Secondary | ICD-10-CM

## 2019-03-18 DIAGNOSIS — M79675 Pain in left toe(s): Secondary | ICD-10-CM | POA: Diagnosis not present

## 2019-03-18 DIAGNOSIS — B351 Tinea unguium: Secondary | ICD-10-CM
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
## 2019-03-22 ENCOUNTER — Ambulatory Visit: Payer: Medicare Other | Admitting: Podiatry

## 2019-03-23 ENCOUNTER — Other Ambulatory Visit: Payer: Self-pay | Admitting: Cardiology

## 2019-04-11 ENCOUNTER — Telehealth: Payer: Self-pay | Admitting: Cardiology

## 2019-04-11 NOTE — Telephone Encounter (Signed)
2 boxes eliquis 5 mg to pt lot [id number], exp 05/2021  LMTCB

## 2019-04-11 NOTE — Telephone Encounter (Signed)
Patient calling the office for samples of medication: ° ° °1.  What medication and dosage are you requesting samples for? apixaban (ELIQUIS) 5 MG TABS tablet ° °2.  Are you currently out of this medication? no ° ° °

## 2019-05-11 ENCOUNTER — Telehealth: Payer: Self-pay | Admitting: *Deleted

## 2019-05-11 NOTE — Telephone Encounter (Signed)
Pt contacted per Dr Wyline Mood. History reviewed. No symptoms to suggest any unstable cardiac conditions. Based on discussion, with current pandemic situation, we have postponed 05/16/19 appointment until [facility]. If symptoms change, pt has been instructed to contact our office

## 2019-05-16 ENCOUNTER — Ambulatory Visit: Payer: Medicare Other | Admitting: Cardiology

## 2019-06-14 ENCOUNTER — Other Ambulatory Visit: Payer: Self-pay | Admitting: Cardiology

## 2019-06-22 ENCOUNTER — Ambulatory Visit: Payer: Medicare Other | Admitting: Podiatry

## 2019-07-15 ENCOUNTER — Other Ambulatory Visit: Payer: Self-pay | Admitting: Cardiology

## 2019-07-18 ENCOUNTER — Telehealth: Payer: Self-pay | Admitting: Cardiology

## 2019-07-18 MED ORDER — APIXABAN 5 MG PO TABS: ORAL_TABLET | ORAL | 0 refills | Status: DC

## 2019-07-18 NOTE — Telephone Encounter (Signed)
Pt will come by office and call and we will bring Eliquis samples to her

## 2019-07-18 NOTE — Telephone Encounter (Signed)
Patient calling the office for samples of medication: ° ° °1.  What medication and dosage are you requesting samples for?   (ELIQUIS) 5 MG TABS tablet  ° ° °2.  Are you currently out of this medication?  ° ° ° °

## 2019-08-05 ENCOUNTER — Encounter: Payer: Self-pay | Admitting: *Deleted

## 2019-08-08 ENCOUNTER — Ambulatory Visit (INDEPENDENT_AMBULATORY_CARE_PROVIDER_SITE_OTHER): Payer: Medicare Other | Admitting: Cardiology

## 2019-08-08 ENCOUNTER — Encounter: Payer: Self-pay | Admitting: *Deleted

## 2019-08-08 ENCOUNTER — Encounter: Payer: Self-pay | Admitting: Cardiology

## 2019-08-08 ENCOUNTER — Other Ambulatory Visit: Payer: Self-pay

## 2019-08-08 VITALS — BP 157/74 | HR 64 | Ht 67.0 in | Wt 211.6 lb

## 2019-08-08 DIAGNOSIS — I10 Essential (primary) hypertension: Secondary | ICD-10-CM

## 2019-08-08 DIAGNOSIS — I4891 Unspecified atrial fibrillation: Secondary | ICD-10-CM | POA: Diagnosis not present

## 2019-08-08 MED ORDER — APIXABAN 5 MG PO TABS: ORAL_TABLET | ORAL | 0 refills | Status: DC
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
## 2019-08-08 NOTE — Patient Instructions (Signed)

## 2019-08-13 ENCOUNTER — Other Ambulatory Visit: Payer: Self-pay | Admitting: Cardiology

## 2019-08-19 ENCOUNTER — Ambulatory Visit: Payer: Medicare Other | Admitting: Podiatry

## 2019-08-26 DIAGNOSIS — N398 Other specified disorders of urinary system: Secondary | ICD-10-CM | POA: Insufficient documentation

## 2019-08-26 DIAGNOSIS — K469 Unspecified abdominal hernia without obstruction or gangrene: Secondary | ICD-10-CM | POA: Insufficient documentation

## 2019-09-07 ENCOUNTER — Telehealth: Payer: Self-pay | Admitting: Cardiology

## 2019-09-07 ENCOUNTER — Other Ambulatory Visit: Payer: Self-pay | Admitting: Cardiology

## 2019-09-07 NOTE — Telephone Encounter (Signed)
Asking for Eliquis samples. Said she can not afford it  Husband is in the area and would like to pick up

## 2019-09-07 NOTE — Telephone Encounter (Signed)
Left message to call back - left detailed information that we do not have samples currently.  She may could try a free 30-day trial offer card, but if she has ever used one before - my not work again.  Also, can give info on [employer] to see if they can assist her in any way.

## 2019-09-12 NOTE — Telephone Encounter (Signed)
Returning call  Asking for Eliquis samples and stated that she need another form to fill out .

## 2019-09-13 NOTE — Telephone Encounter (Signed)
Left message - no samples available , mailed patient assistance form -  Request patient to take to the [facility] office once completed

## 2019-10-12 ENCOUNTER — Other Ambulatory Visit: Payer: Self-pay | Admitting: Cardiology

## 2019-11-18 ENCOUNTER — Encounter: Payer: Self-pay | Admitting: Podiatry

## 2019-11-18 ENCOUNTER — Other Ambulatory Visit: Payer: Self-pay

## 2019-11-18 ENCOUNTER — Ambulatory Visit (INDEPENDENT_AMBULATORY_CARE_PROVIDER_SITE_OTHER): Payer: Medicare Other | Admitting: Podiatry

## 2019-11-18 DIAGNOSIS — M79675 Pain in left toe(s): Secondary | ICD-10-CM

## 2019-11-18 DIAGNOSIS — M79674 Pain in right toe(s): Secondary | ICD-10-CM | POA: Diagnosis not present

## 2019-11-18 DIAGNOSIS — B351 Tinea unguium: Secondary | ICD-10-CM

## 2019-11-18 DIAGNOSIS — L84 Corns and callosities: Secondary | ICD-10-CM

## 2019-11-18 DIAGNOSIS — W19XXXA Unspecified fall, initial encounter: Secondary | ICD-10-CM

## 2019-11-18 DIAGNOSIS — E1151 Type 2 diabetes mellitus with diabetic peripheral angiopathy without gangrene: Secondary | ICD-10-CM

## 2019-11-18 DIAGNOSIS — M79671 Pain in right foot: Secondary | ICD-10-CM | POA: Diagnosis not present

## 2019-11-18 NOTE — Patient Instructions (Addendum)
Understanding Your Risk for Falls Each year, millions of people suffer serious injuries from falls. It is important to understand your risk for falling. Talk with your health care provider about your risk and what you can do to lower it. There are actions you can take at home to lower your risk. If you do have a serious fall, it is important to tell your health care provider. Falling once raises your risk for falling again. How can falls affect me? Serious injuries from falls are common. These include:  Broken bones. Most hip fractures are caused by falls.  Traumatic brain injury (TBI). Falls are the most common cause of TBI. Fear of falling can also cause you to avoid activities and stay at home. This can make your muscles weaker and actually raise your risk for a fall. What can increase my risk? Serious injuries from a fall most often happen to people older than age 65. Children and young adults ages 15-29 are also at higher risk. The more risk factors you have for falling, the higher your risk. Risk factors include:  Weakness in the lower body.  Lack (deficiency) of vitamin D.  Weak bones (osteoporosis).  Being generally weak or confused due to long-term (chronic) illness.  Dizziness or balance problems.  Poor vision.  Having depression.  Medicine that causes dizziness or drowsiness. These can include medicines for your blood pressure, heart, anxiety, insomnia, or edema, as well as pain medicines and muscle relaxants.  Drinking alcohol.  Foot pain or improper footwear.  Working at a dangerous job.  Having had a fall in the past.  Tripping hazards at home, such as floor clutter or loose rugs, or poor lighting.  Having pets or clutter in your home. What actions can I take to lower my risk of falling?      Maintain physical fitness: ? Do strength and balance exercises. Consider taking a regular class to build strength and balance. Yoga and tai chi are good options. ?  Have your eyes checked every year and your vision prescription updated as needed.  Remove all clutter from walkways and stairways, including extension cords.  Use a cordless phone.  Do not use throw rugs. Make sure all carpeting is taped or tacked down securely.  Use good lighting in all rooms. Keep a flashlight near your bed.  Make sure there is a clear path from your bed to the bathroom. Use night-lights.  Install grab bars for your tub, shower, and toilet. Use a bath mat in your tub or shower.  Attach secure railings on both sides of your stairs.  Repair uneven or broken steps.  Use a cane or walker as directed by your health care provider.  Wear nonskid shoes. Do not wear high heels. Do not walk around the house in socks or slippers.  Avoid walking on icy or slippery surfaces. Walk on the grass instead of on icy or slick sidewalks. Where you can, use ice melt to get rid of ice on walkways. Questions to ask your health care provider  Can you help me evaluate my risk for a fall?  Do any of my medicines make me more likely to fall?  Should I take a vitamin D supplement?  What exercises can I do to improve my strength and balance?  Should I make an appointment to have my vision checked?  Do I need a bone density test to check for osteoporosis?  Would it help to use a cane or a walker? Where   to find more information  Centers for Disease Control and Prevention, STEADI: StoreMirror.com.cy  Community-Based Fall Prevention Programs: StoreMirror.com.cy  [employer] on Aging: ToneConnect.com.ee Contact a health care provider if:  You fall at home.  You are afraid of falling at home.  You feel weak, drowsy, or dizzy at home. Summary  People 50 and older are at high risk for falling. However, older people are not the only ones injured in falls. Children and young adults have a higher-than-normal risk, too.  Talk with your health care provider about your risks for falling and how to  lower those risks.  Taking certain precautions at home can lower your risk for falling.  If you fall, always tell your health care provider. This information is not intended to replace advice given to you by your health care provider. Make sure you discuss any questions you have with your health care provider. Document Released: 07/29/2017 Document Revised: 03/16/2018 Document Reviewed: 07/29/2017 Elsevier Patient Education  [place] are small areas of thickened skin that occur on the top, sides, or tip of a toe. They contain a cone-shaped core with a point that can press on a nerve below. This causes pain.  Calluses are areas of thickened skin that can occur anywhere on the body, including the hands, fingers, palms, soles of the feet, and heels. Calluses are usually larger than corns. What are the causes? Corns and calluses are caused by rubbing (friction) or pressure, such as from shoes that are too tight or do not fit properly. What increases the risk? Corns are more likely to develop in people who have misshapen toes (toe deformities), such as hammer toes. Calluses can occur with friction to any area of the skin. They are more likely to develop in people who:  Work with their hands.  Wear shoes that fit poorly, are too tight, or are high-heeled.  Have toe deformities. What are the signs or symptoms? Symptoms of a corn or callus include:  A hard growth on the skin.  Pain or tenderness under the skin.  Redness and swelling.  Increased discomfort while wearing tight-fitting shoes, if your feet are affected. If a corn or callus becomes infected, symptoms may include:  Redness and swelling that gets worse.  Pain.  Fluid, blood, or pus draining from the corn or callus. How is this diagnosed? Corns and calluses may be diagnosed based on your symptoms, your medical history, and a physical exam. How is this treated? Treatment for corns and  calluses may include:  Removing the cause of the friction or pressure. This may involve: ? Changing your shoes. ? Wearing shoe inserts (orthotics) or other protective layers in your shoes, such as a corn pad. ? Wearing gloves.  Applying medicine to the skin (topical medicine) to help soften skin in the hardened, thickened areas.  Removing layers of dead skin with a file to reduce the size of the corn or callus.  Removing the corn or callus with a scalpel or laser.  Taking antibiotic medicines, if your corn or callus is infected.  Having surgery, if a toe deformity is the cause. Follow these instructions at home:   Take over-the-counter and prescription medicines only as told by your health care provider.  If you were prescribed an antibiotic, take it as told by your health care provider. Do not stop taking it even if your condition starts to improve.  Wear shoes that fit well. Avoid wearing high-heeled shoes  and shoes that are too tight or too loose.  Wear any padding, protective layers, gloves, or orthotics as told by your health care provider.  Soak your hands or feet and then use a file or pumice stone to soften your corn or callus. Do this as told by your health care provider.  Check your corn or callus every day for symptoms of infection. Contact a health care provider if you:  Notice that your symptoms do not improve with treatment.  Have redness or swelling that gets worse.  Notice that your corn or callus becomes painful.  Have fluid, blood, or pus coming from your corn or callus.  Have new symptoms. Summary  Corns are small areas of thickened skin that occur on the top, sides, or tip of a toe.  Calluses are areas of thickened skin that can occur anywhere on the body, including the hands, fingers, palms, and soles of the feet. Calluses are usually larger than corns.  Corns and calluses are caused by rubbing (friction) or pressure, such as from shoes that are too  tight or do not fit properly.  Treatment may include wearing any padding, protective layers, gloves, or orthotics as told by your health care provider. This information is not intended to replace advice given to you by your health care provider. Make sure you discuss any questions you have with your health care provider. Document Released: 09/20/2004 Document Revised: 04/06/2019 Document Reviewed: 10/28/2017 Elsevier Patient Education  [place]. Diabetes Mellitus and [place] care is an important part of your health, especially when you have diabetes. Diabetes may cause you to have problems because of poor blood flow (circulation) to your feet and legs, which can cause your skin to:  Become thinner and drier.  Break more easily.  Heal more slowly.  Peel and crack. You may also have nerve damage (neuropathy) in your legs and feet, causing decreased feeling in them. This means that you may not notice minor injuries to your feet that could lead to more serious problems. Noticing and addressing any potential problems early is the best way to prevent future foot problems. How to care for your feet Foot hygiene  Wash your feet daily with warm water and mild soap. Do not use hot water. Then, pat your feet and the areas between your toes until they are completely dry. Do not soak your feet as this can dry your skin.  Trim your toenails straight across. Do not dig under them or around the cuticle. File the edges of your nails with an emery board or nail file.  Apply a moisturizing lotion or petroleum jelly to the skin on your feet and to dry, brittle toenails. Use lotion that does not contain alcohol and is unscented. Do not apply lotion between your toes. Shoes and socks  Wear clean socks or stockings every day. Make sure they are not too tight. Do not wear knee-high stockings since they may decrease blood flow to your legs.  Wear shoes that fit properly and have enough  cushioning. Always look in your shoes before you put them on to be sure there are no objects inside.  To break in new shoes, wear them for just a few hours a day. This prevents injuries on your feet. Wounds, scrapes, corns, and calluses  Check your feet daily for blisters, cuts, bruises, sores, and redness. If you cannot see the bottom of your feet, use a mirror or ask someone for help.  Do not  cut corns or calluses or try to remove them with medicine.  If you find a minor scrape, cut, or break in the skin on your feet, keep it and the skin around it clean and dry. You may clean these areas with mild soap and water. Do not clean the area with peroxide, alcohol, or iodine.  If you have a wound, scrape, corn, or callus on your foot, look at it several times a day to make sure it is healing and not infected. Check for: ? Redness, swelling, or pain. ? Fluid or blood. ? Warmth. ? Pus or a bad smell. General instructions  Do not cross your legs. This may decrease blood flow to your feet.  Do not use heating pads or hot water bottles on your feet. They may burn your skin. If you have lost feeling in your feet or legs, you may not know this is happening until it is too late.  Protect your feet from hot and cold by wearing shoes, such as at the beach or on hot pavement.  Schedule a complete foot exam at least once a year (annually) or more often if you have foot problems. If you have foot problems, report any cuts, sores, or bruises to your health care provider immediately. Contact a health care provider if:  You have a medical condition that increases your risk of infection and you have any cuts, sores, or bruises on your feet.  You have an injury that is not healing.  You have redness on your legs or feet.  You feel burning or tingling in your legs or feet.  You have pain or cramps in your legs and feet.  Your legs or feet are numb.  Your feet always feel cold.  You have pain around  a toenail. Get help right away if:  You have a wound, scrape, corn, or callus on your foot and: ? You have pain, swelling, or redness that gets worse. ? You have fluid or blood coming from the wound, scrape, corn, or callus. ? Your wound, scrape, corn, or callus feels warm to the touch. ? You have pus or a bad smell coming from the wound, scrape, corn, or callus. ? You have a fever. ? You have a red line going up your leg. Summary  Check your feet every day for cuts, sores, red spots, swelling, and blisters.  Moisturize feet and legs daily.  Wear shoes that fit properly and have enough cushioning.  If you have foot problems, report any cuts, sores, or bruises to your health care provider immediately.  Schedule a complete foot exam at least once a year (annually) or more often if you have foot problems. This information is not intended to replace advice given to you by your health care provider. Make sure you discuss any questions you have with your health care provider. Document Released: 12/12/2000 Document Revised: 01/27/2018 Document Reviewed: 01/16/2017 Elsevier Patient Education  2020 [employer].
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
## 2019-12-13 ENCOUNTER — Other Ambulatory Visit: Payer: Self-pay | Admitting: Cardiology

## 2020-01-10 ENCOUNTER — Other Ambulatory Visit: Payer: Self-pay | Admitting: Cardiology

## 2020-01-11 DIAGNOSIS — M545 Low back pain, unspecified: Secondary | ICD-10-CM | POA: Insufficient documentation

## 2020-01-19 DIAGNOSIS — Z96642 Presence of left artificial hip joint: Secondary | ICD-10-CM | POA: Insufficient documentation

## 2020-01-25 DIAGNOSIS — M5136 Other intervertebral disc degeneration, lumbar region: Secondary | ICD-10-CM | POA: Insufficient documentation

## 2020-02-10 ENCOUNTER — Other Ambulatory Visit: Payer: Self-pay | Admitting: Cardiology

## 2020-02-16 ENCOUNTER — Telehealth: Payer: Self-pay | Admitting: Cardiology

## 2020-02-16 NOTE — Telephone Encounter (Signed)
Pt aware that she could pick up Elqiquis samples after 11am tomorrow

## 2020-02-16 NOTE — Telephone Encounter (Signed)
Asking for Eliquis samples  °

## 2020-02-17 MED ORDER — APIXABAN 5 MG PO TABS: ORAL_TABLET | ORAL | 0 refills | Status: DC

## 2020-02-17 NOTE — Telephone Encounter (Signed)
Pt samples given

## 2020-02-17 NOTE — Addendum Note (Signed)
Addended by: Burman Nieves T on: 02/17/2020 11:46 AM   Modules accepted: Orders

## 2020-02-20 ENCOUNTER — Encounter: Payer: Self-pay | Admitting: Podiatry

## 2020-02-20 ENCOUNTER — Other Ambulatory Visit: Payer: Self-pay

## 2020-02-20 ENCOUNTER — Ambulatory Visit (INDEPENDENT_AMBULATORY_CARE_PROVIDER_SITE_OTHER): Payer: Medicare Other | Admitting: Podiatry

## 2020-02-20 DIAGNOSIS — B351 Tinea unguium: Secondary | ICD-10-CM

## 2020-02-20 DIAGNOSIS — E1151 Type 2 diabetes mellitus with diabetic peripheral angiopathy without gangrene: Secondary | ICD-10-CM

## 2020-02-20 DIAGNOSIS — M79674 Pain in right toe(s): Secondary | ICD-10-CM

## 2020-02-20 DIAGNOSIS — L84 Corns and callosities: Secondary | ICD-10-CM | POA: Diagnosis not present

## 2020-02-20 DIAGNOSIS — M79675 Pain in left toe(s): Secondary | ICD-10-CM | POA: Diagnosis not present

## 2020-02-20 DIAGNOSIS — L853 Xerosis cutis: Secondary | ICD-10-CM | POA: Diagnosis not present

## 2020-02-20 NOTE — Patient Instructions (Addendum)
Apply Aquaphor Ointment to both feet twice daily for one week and then start applying once daily for dry skin.   Diabetes Mellitus and Foot Care Foot care is an important part of your health, especially when you have diabetes. Diabetes may cause you to have problems because of poor blood flow (circulation) to your feet and legs, which can cause your skin to:  Become thinner and drier.  Break more easily.  Heal more slowly.  Peel and crack. You may also have nerve damage (neuropathy) in your legs and feet, causing decreased feeling in them. This means that you may not notice minor injuries to your feet that could lead to more serious problems. Noticing and addressing any potential problems early is the best way to prevent future foot problems. How to care for your feet Foot hygiene  Wash your feet daily with warm water and mild soap. Do not use hot water. Then, pat your feet and the areas between your toes until they are completely dry. Do not soak your feet as this can dry your skin.  Trim your toenails straight across. Do not dig under them or around the cuticle. File the edges of your nails with an emery board or nail file.  Apply a moisturizing lotion or petroleum jelly to the skin on your feet and to dry, brittle toenails. Use lotion that does not contain alcohol and is unscented. Do not apply lotion between your toes. Shoes and socks  Wear clean socks or stockings every day. Make sure they are not too tight. Do not wear knee-high stockings since they may decrease blood flow to your legs.  Wear shoes that fit properly and have enough cushioning. Always look in your shoes before you put them on to be sure there are no objects inside.  To break in new shoes, wear them for just a few hours a day. This prevents injuries on your feet. Wounds, scrapes, corns, and calluses  Check your feet daily for blisters, cuts, bruises, sores, and redness. If you cannot see the bottom of your feet,  use a mirror or ask someone for help.  Do not cut corns or calluses or try to remove them with medicine.  If you find a minor scrape, cut, or break in the skin on your feet, keep it and the skin around it clean and dry. You may clean these areas with mild soap and water. Do not clean the area with peroxide, alcohol, or iodine.  If you have a wound, scrape, corn, or callus on your foot, look at it several times a day to make sure it is healing and not infected. Check for: ? Redness, swelling, or pain. ? Fluid or blood. ? Warmth. ? Pus or a bad smell. General instructions  Do not cross your legs. This may decrease blood flow to your feet.  Do not use heating pads or hot water bottles on your feet. They may burn your skin. If you have lost feeling in your feet or legs, you may not know this is happening until it is too late.  Protect your feet from hot and cold by wearing shoes, such as at the beach or on hot pavement.  Schedule a complete foot exam at least once a year (annually) or more often if you have foot problems. If you have foot problems, report any cuts, sores, or bruises to your health care provider immediately. Contact a health care provider if:  You have a medical condition that increases your  risk of infection and you have any cuts, sores, or bruises on your feet.  You have an injury that is not healing.  You have redness on your legs or feet.  You feel burning or tingling in your legs or feet.  You have pain or cramps in your legs and feet.  Your legs or feet are numb.  Your feet always feel cold.  You have pain around a toenail. Get help right away if:  You have a wound, scrape, corn, or callus on your foot and: ? You have pain, swelling, or redness that gets worse. ? You have fluid or blood coming from the wound, scrape, corn, or callus. ? Your wound, scrape, corn, or callus feels warm to the touch. ? You have pus or a bad smell coming from the wound, scrape,  corn, or callus. ? You have a fever. ? You have a red line going up your leg. Summary  Check your feet every day for cuts, sores, red spots, swelling, and blisters.  Moisturize feet and legs daily.  Wear shoes that fit properly and have enough cushioning.  If you have foot problems, report any cuts, sores, or bruises to your health care provider immediately.  Schedule a complete foot exam at least once a year (annually) or more often if you have foot problems. This information is not intended to replace advice given to you by your health care provider. Make sure you discuss any questions you have with your health care provider. Document Revised: 09/07/2019 Document Reviewed: 01/16/2017 Elsevier Patient Education  [place].

## 2020-02-21 ENCOUNTER — Ambulatory Visit: Payer: Medicare Other | Admitting: Orthotics

## 2020-02-21 ENCOUNTER — Other Ambulatory Visit: Payer: Self-pay | Admitting: Specialist

## 2020-02-21 DIAGNOSIS — M4856XD Collapsed vertebra, not elsewhere classified, lumbar region, subsequent encounter for fracture with routine healing: Secondary | ICD-10-CM

## 2020-02-21 DIAGNOSIS — E1151 Type 2 diabetes mellitus with diabetic peripheral angiopathy without gangrene: Secondary | ICD-10-CM

## 2020-02-21 DIAGNOSIS — M79675 Pain in left toe(s): Secondary | ICD-10-CM

## 2020-02-21 DIAGNOSIS — B351 Tinea unguium: Secondary | ICD-10-CM

## 2020-02-21 DIAGNOSIS — L84 Corns and callosities: Secondary | ICD-10-CM

## 2020-02-21 NOTE — Progress Notes (Signed)
Cast patient for DBS to accommodate Az brace RT.   Patient picked out apex velcro athletic shoe 10W.   Patient was hoping the shoes would aide in balance.  I told her that the shoes primarily are for protection against issues secondary to DM2; although they have stiff counter that will aide in ankle stability, they are not a cure all for balance issues.
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
## 2020-03-20 ENCOUNTER — Other Ambulatory Visit: Payer: Self-pay | Admitting: Cardiology

## 2020-03-20 ENCOUNTER — Other Ambulatory Visit: Payer: Self-pay

## 2020-03-20 ENCOUNTER — Ambulatory Visit
Admission: RE | Admit: 2020-03-20 | Discharge: 2020-03-20 | Disposition: A | Discharge status: Home / Self Care | Payer: Medicare Other | Source: Ambulatory Visit | Attending: Specialist | Admitting: Specialist

## 2020-03-20 ENCOUNTER — Ambulatory Visit: Payer: Medicare Other | Admitting: Orthotics

## 2020-03-20 DIAGNOSIS — B351 Tinea unguium: Secondary | ICD-10-CM

## 2020-03-20 DIAGNOSIS — L84 Corns and callosities: Secondary | ICD-10-CM

## 2020-03-20 DIAGNOSIS — E1151 Type 2 diabetes mellitus with diabetic peripheral angiopathy without gangrene: Secondary | ICD-10-CM

## 2020-03-20 DIAGNOSIS — M4856XD Collapsed vertebra, not elsewhere classified, lumbar region, subsequent encounter for fracture with routine healing: Secondary | ICD-10-CM

## 2020-03-20 DIAGNOSIS — M79675 Pain in left toe(s): Secondary | ICD-10-CM

## 2020-03-20 NOTE — Progress Notes (Signed)
Needs larger size shoe.

## 2020-03-27 DIAGNOSIS — M8580 Other specified disorders of bone density and structure, unspecified site: Secondary | ICD-10-CM | POA: Insufficient documentation

## 2020-04-13 ENCOUNTER — Ambulatory Visit: Payer: Medicare Other | Admitting: Cardiology

## 2020-04-19 ENCOUNTER — Other Ambulatory Visit: Payer: Self-pay | Admitting: Cardiology

## 2020-04-27 ENCOUNTER — Other Ambulatory Visit: Payer: Self-pay

## 2020-04-27 ENCOUNTER — Ambulatory Visit: Payer: Medicare Other | Admitting: Orthotics

## 2020-05-14 ENCOUNTER — Telehealth: Payer: Self-pay | Admitting: Cardiology

## 2020-05-14 NOTE — Telephone Encounter (Signed)
Patient states that her Eliquis is $109.00 for 60 pills. States that she cannot afford this.

## 2020-05-15 DIAGNOSIS — M8000XA Age-related osteoporosis with current pathological fracture, unspecified site, initial encounter for fracture: Secondary | ICD-10-CM | POA: Insufficient documentation

## 2020-05-15 DIAGNOSIS — M810 Age-related osteoporosis without current pathological fracture: Secondary | ICD-10-CM | POA: Insufficient documentation

## 2020-05-15 DIAGNOSIS — S32010A Wedge compression fracture of first lumbar vertebra, initial encounter for closed fracture: Secondary | ICD-10-CM | POA: Insufficient documentation

## 2020-05-21 NOTE — Telephone Encounter (Signed)
Left messages for pt to return call - has appt tomorrow 5/25 will discuss at that time

## 2020-05-22 ENCOUNTER — Encounter: Payer: Self-pay | Admitting: Cardiology

## 2020-05-22 ENCOUNTER — Ambulatory Visit (INDEPENDENT_AMBULATORY_CARE_PROVIDER_SITE_OTHER): Payer: Medicare Other | Admitting: Cardiology

## 2020-05-22 ENCOUNTER — Other Ambulatory Visit: Payer: Self-pay

## 2020-05-22 ENCOUNTER — Ambulatory Visit: Payer: Medicare Other | Admitting: Podiatry

## 2020-05-22 VITALS — BP 124/70 | HR 74 | Ht 64.0 in | Wt 184.4 lb

## 2020-05-22 DIAGNOSIS — I10 Essential (primary) hypertension: Secondary | ICD-10-CM

## 2020-05-22 DIAGNOSIS — I4891 Unspecified atrial fibrillation: Secondary | ICD-10-CM

## 2020-05-22 NOTE — Patient Instructions (Signed)
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
## 2020-05-23 ENCOUNTER — Telehealth: Payer: Self-pay | Admitting: Dermatology

## 2020-05-23 NOTE — Telephone Encounter (Signed)
Phone call to patient to inform her that she can call the [facility] to get her Skyrizi shipped to her home address.  Patient aware and given [facility]'s number.

## 2020-05-23 NOTE — Telephone Encounter (Signed)
Patient is calling to report that she has completed the third dose of skyrizi and needs refills.  Patient says she would like the refills of skyrizi to be mailed to her home address.  Patient says to leave a detailed message if does not answer. Chart 59093

## 2020-05-30 DIAGNOSIS — M1712 Unilateral primary osteoarthritis, left knee: Secondary | ICD-10-CM | POA: Insufficient documentation

## 2020-06-14 ENCOUNTER — Telehealth: Payer: Self-pay | Admitting: Dermatology

## 2020-06-14 ENCOUNTER — Telehealth: Payer: Self-pay | Admitting: *Deleted

## 2020-06-14 NOTE — Telephone Encounter (Signed)
Phone call to [facility] to have them to start sending the patients Skyrizi to her home. Per [employer] nurse we need to speak with [facility] [employer] [facility] since patient is enrolled in the patient assistance program.  The nurse did transfer me over to [facility] and also gave me the telephone number to [facility]  [phone] option number [phone].  Spoke with Ricki Rodriguez from [facility] to have the patient's shipment information sent to her home.

## 2020-06-14 NOTE — Telephone Encounter (Signed)
Phone call to patient to inform her that I spoke with [facility] and they will now be shipping her Cristy Folks to her home.  Patient aware.

## 2020-06-14 NOTE — Telephone Encounter (Signed)
Pt approved for Eliquis from [employer] from 06/13/2020 through 12/28/2020 Case # [id number]

## 2020-06-14 NOTE — Telephone Encounter (Signed)
Called [facility], was told we have to tell them med can be sent to her house, She spoke to an [place] and was told we should ask for her

## 2020-07-03 ENCOUNTER — Telehealth: Payer: Self-pay | Admitting: Cardiology

## 2020-07-03 NOTE — Telephone Encounter (Signed)
Patient called stating that she has not received Eliquis thru the mail.

## 2020-07-03 NOTE — Telephone Encounter (Signed)
Spoke with [employer] who verified pt need to call and set up shipping info - gave pt phone # [phone] option 6 - pt appreciative

## 2020-07-08 ENCOUNTER — Other Ambulatory Visit: Payer: Self-pay | Admitting: Cardiology

## 2020-07-09 ENCOUNTER — Other Ambulatory Visit: Payer: Self-pay | Admitting: Cardiology

## 2020-07-11 ENCOUNTER — Ambulatory Visit: Payer: Medicare Other | Admitting: Dermatology

## 2020-07-18 DIAGNOSIS — M6289 Other specified disorders of muscle: Secondary | ICD-10-CM | POA: Insufficient documentation

## 2020-07-18 DIAGNOSIS — N9489 Other specified conditions associated with female genital organs and menstrual cycle: Secondary | ICD-10-CM | POA: Insufficient documentation

## 2020-07-20 DIAGNOSIS — Z9181 History of falling: Secondary | ICD-10-CM | POA: Insufficient documentation

## 2020-07-20 DIAGNOSIS — Z87891 Personal history of nicotine dependence: Secondary | ICD-10-CM | POA: Insufficient documentation

## 2020-07-20 DIAGNOSIS — F32A Depression, unspecified: Secondary | ICD-10-CM | POA: Insufficient documentation

## 2020-07-20 DIAGNOSIS — K219 Gastro-esophageal reflux disease without esophagitis: Secondary | ICD-10-CM | POA: Insufficient documentation

## 2020-07-26 ENCOUNTER — Other Ambulatory Visit: Payer: Self-pay | Admitting: Cardiology

## 2020-07-27 ENCOUNTER — Ambulatory Visit: Payer: Medicare Other | Admitting: Podiatry

## 2020-08-07 ENCOUNTER — Other Ambulatory Visit: Payer: Self-pay | Admitting: Cardiology

## 2020-08-23 ENCOUNTER — Telehealth: Payer: Self-pay | Admitting: Dermatology

## 2020-08-23 NOTE — Telephone Encounter (Signed)
Patient would like to speak to ST about new diagnoses of "thyroid eyes". Her oculopathy specialist Dr Dimple Casey at [facility] is going to follow up with her next month and will posibly start a new biologic (Teprazzi). She would like ST's opinion on this and also wants to know if its ok to take 2 biologic medications at the same time.

## 2020-08-31 ENCOUNTER — Telehealth: Payer: Self-pay | Admitting: Cardiology

## 2020-08-31 NOTE — Telephone Encounter (Signed)
Patient called requesting to talk to someone about the COVID vaccine.

## 2020-08-31 NOTE — Telephone Encounter (Signed)
Pt aware that our provider recommend getting the COVID vaccine

## 2020-09-07 ENCOUNTER — Other Ambulatory Visit: Payer: Self-pay | Admitting: *Deleted

## 2020-09-07 MED ORDER — SKYRIZI PEN 150 MG/ML ~~LOC~~ SOAJ: 1.0000 | SUBCUTANEOUS | 3 refills | Status: DC

## 2020-09-11 ENCOUNTER — Telehealth: Payer: Self-pay | Admitting: Dermatology

## 2020-09-11 NOTE — Telephone Encounter (Signed)
Patient left message on office voice mail saying that she had received a phone call from [facility] saying that there was a new dosage that is being offered.  Patient would like for this office to contact [facility] to let them know that she  Would like to stay with the same dosage that she is at now--2 shots.

## 2020-09-11 NOTE — Telephone Encounter (Signed)
Patient didn't know that the manufacturer was changing skyrizi dose.

## 2020-09-19 ENCOUNTER — Other Ambulatory Visit: Payer: Self-pay | Admitting: Neurosurgery

## 2020-09-19 DIAGNOSIS — S32010A Wedge compression fracture of first lumbar vertebra, initial encounter for closed fracture: Secondary | ICD-10-CM

## 2020-09-19 DIAGNOSIS — M8000XA Age-related osteoporosis with current pathological fracture, unspecified site, initial encounter for fracture: Secondary | ICD-10-CM

## 2020-09-28 ENCOUNTER — Encounter: Payer: Self-pay | Admitting: Podiatry

## 2020-09-28 ENCOUNTER — Ambulatory Visit (INDEPENDENT_AMBULATORY_CARE_PROVIDER_SITE_OTHER): Payer: Medicare Other | Admitting: Podiatry

## 2020-09-28 ENCOUNTER — Other Ambulatory Visit: Payer: Self-pay

## 2020-09-28 DIAGNOSIS — E1151 Type 2 diabetes mellitus with diabetic peripheral angiopathy without gangrene: Secondary | ICD-10-CM | POA: Diagnosis not present

## 2020-09-28 DIAGNOSIS — B351 Tinea unguium: Secondary | ICD-10-CM | POA: Diagnosis not present

## 2020-09-28 DIAGNOSIS — L84 Corns and callosities: Secondary | ICD-10-CM

## 2020-09-28 DIAGNOSIS — M79674 Pain in right toe(s): Secondary | ICD-10-CM

## 2020-09-28 DIAGNOSIS — M79675 Pain in left toe(s): Secondary | ICD-10-CM
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
## 2020-10-10 ENCOUNTER — Other Ambulatory Visit: Payer: Self-pay | Admitting: Dermatology

## 2020-10-10 NOTE — Telephone Encounter (Signed)
Called [facility] and skyrizi prefilled syringe on back order- verbal given for syringe; I called patient to let her know the issue is resolved and that she should call pharmacy at [phone] option 1 to confirm delivery.

## 2020-10-10 NOTE — Telephone Encounter (Signed)
Patient left message on office saying that per pharmacy we need to contact them to let them know if the prescription for Veronica Harrison is for 150 mg/ml auto injector pen or prefilled syringe.  Patient wants someone from our office to call her as well to let her know when this has been done.  (Chart [id number])

## 2020-10-12 ENCOUNTER — Other Ambulatory Visit: Payer: Medicare Other

## 2020-10-13 ENCOUNTER — Other Ambulatory Visit: Payer: Medicare Other

## 2020-10-15 ENCOUNTER — Ambulatory Visit
Admission: RE | Admit: 2020-10-15 | Discharge: 2020-10-15 | Disposition: A | Discharge status: Home / Self Care | Payer: Medicare Other | Source: Ambulatory Visit | Attending: Neurosurgery | Admitting: Neurosurgery

## 2020-10-15 ENCOUNTER — Other Ambulatory Visit: Payer: Self-pay

## 2020-10-15 ENCOUNTER — Encounter (INDEPENDENT_AMBULATORY_CARE_PROVIDER_SITE_OTHER): Payer: Self-pay

## 2020-10-15 DIAGNOSIS — M8000XA Age-related osteoporosis with current pathological fracture, unspecified site, initial encounter for fracture: Secondary | ICD-10-CM

## 2020-10-15 DIAGNOSIS — S32010A Wedge compression fracture of first lumbar vertebra, initial encounter for closed fracture: Secondary | ICD-10-CM

## 2020-11-06 ENCOUNTER — Encounter: Payer: Self-pay | Admitting: Dermatology

## 2020-11-06 ENCOUNTER — Encounter (INDEPENDENT_AMBULATORY_CARE_PROVIDER_SITE_OTHER): Payer: Self-pay

## 2020-11-06 ENCOUNTER — Ambulatory Visit (INDEPENDENT_AMBULATORY_CARE_PROVIDER_SITE_OTHER): Payer: Medicare Other | Admitting: Dermatology

## 2020-11-06 ENCOUNTER — Other Ambulatory Visit: Payer: Self-pay

## 2020-11-06 DIAGNOSIS — L409 Psoriasis, unspecified: Secondary | ICD-10-CM

## 2020-11-06 DIAGNOSIS — Z79899 Other long term (current) drug therapy: Secondary | ICD-10-CM

## 2020-11-06 DIAGNOSIS — Z1283 Encounter for screening for malignant neoplasm of skin: Secondary | ICD-10-CM | POA: Diagnosis not present

## 2020-11-06 NOTE — Progress Notes (Signed)
° °  Follow-Up Visit   Subjective  Veronica Harrison is a 69 y.o. female who presents for the following: Follow-up (SKYRIZI doing good ).  Cysts Location: All over Duration:  Quality: Almost clear Associated Signs/Symptoms: Modifying Factors: Skyrizi Severity:  Timing: Context: Concurrent joints pain  Objective  Well appearing patient in no apparent distress; mood and affect are within normal limits.  All sun exposed areas plus back examined.  Plus chest   Assessment & Plan    Psoriasis Chest - Medial [facility])  Continue Skyrizi.  Other Related Procedures QuantiFERON-TB Gold Plus  Drug therapy  Other Related Procedures QuantiFERON-TB Gold Plus  Screening exam for skin cancer Chest - Medial [facility])  Annual skin examination.    Chronically edematous lower legs, scar from previous procedures on the right shin at the edge of the scar is a subtle dermal thickening which I cannot diagnose but as long as it is stable I do not plan on obtaining biopsy.  Discussed puffiness under her eyes which she relates to her thyroid disease.  Skin is essentially clear.  Continues to have joint pains. I, Janalyn Harder, MD, have reviewed all documentation for this visit.  The documentation on 11/20/20 for the exam, diagnosis, procedures, and orders are all accurate and complete.

## 2020-11-14 ENCOUNTER — Telehealth: Payer: Self-pay | Admitting: *Deleted

## 2020-11-14 NOTE — Telephone Encounter (Signed)
Called patient to see if they did a TB Quantiferon Gold at office visit, because I received her results and that wasn't included.  She will call the office where they did labs and call me back.

## 2020-11-27 ENCOUNTER — Encounter: Payer: Self-pay | Admitting: *Deleted

## 2020-11-27 ENCOUNTER — Ambulatory Visit (INDEPENDENT_AMBULATORY_CARE_PROVIDER_SITE_OTHER): Payer: Medicare Other | Admitting: Cardiology

## 2020-11-27 ENCOUNTER — Encounter: Payer: Self-pay | Admitting: Cardiology

## 2020-11-27 VITALS — BP 144/64 | HR 60 | Ht 64.0 in | Wt 193.0 lb

## 2020-11-27 DIAGNOSIS — E119 Type 2 diabetes mellitus without complications: Secondary | ICD-10-CM

## 2020-11-27 DIAGNOSIS — I10 Essential (primary) hypertension: Secondary | ICD-10-CM | POA: Diagnosis not present

## 2020-11-27 DIAGNOSIS — I4891 Unspecified atrial fibrillation: Secondary | ICD-10-CM | POA: Diagnosis not present
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
## 2020-11-27 NOTE — Patient Instructions (Signed)
Your physician wants you to follow-up in: 6 MONTHS WITH DR [facility]   Your physician recommends that you continue on your current medications as directed. Please refer to the Current Medication list given to you today.  Your physician recommends that you return for lab work LIPIDS - PLEASE FAST 6-8 HOURS PRIOR TO LAB WORK   Thank you for choosing [facility]!!

## 2020-12-18 ENCOUNTER — Other Ambulatory Visit: Payer: Self-pay | Admitting: *Deleted

## 2020-12-18 MED ORDER — APIXABAN 5 MG PO TABS: ORAL_TABLET | ORAL | 3 refills | Status: DC

## 2021-01-04 ENCOUNTER — Ambulatory Visit: Payer: Medicare Other | Admitting: Podiatry

## 2021-01-18 ENCOUNTER — Ambulatory Visit: Payer: Medicare Other | Admitting: Podiatry

## 2021-01-25 ENCOUNTER — Ambulatory Visit (INDEPENDENT_AMBULATORY_CARE_PROVIDER_SITE_OTHER): Payer: Medicare Other | Admitting: Podiatry

## 2021-01-25 ENCOUNTER — Other Ambulatory Visit: Payer: Self-pay

## 2021-01-25 DIAGNOSIS — E1151 Type 2 diabetes mellitus with diabetic peripheral angiopathy without gangrene: Secondary | ICD-10-CM | POA: Diagnosis not present

## 2021-01-25 DIAGNOSIS — L84 Corns and callosities: Secondary | ICD-10-CM

## 2021-01-25 DIAGNOSIS — M79675 Pain in left toe(s): Secondary | ICD-10-CM | POA: Diagnosis not present

## 2021-01-25 DIAGNOSIS — M79674 Pain in right toe(s): Secondary | ICD-10-CM | POA: Diagnosis not present

## 2021-01-25 DIAGNOSIS — B351 Tinea unguium: Secondary | ICD-10-CM | POA: Diagnosis not present

## 2021-01-28 ENCOUNTER — Other Ambulatory Visit: Payer: Self-pay | Admitting: Cardiology

## 2021-01-29 ENCOUNTER — Encounter: Payer: Self-pay | Admitting: Podiatry

## 2021-01-29 ENCOUNTER — Telehealth: Payer: Self-pay | Admitting: Cardiology

## 2021-01-29 NOTE — Telephone Encounter (Signed)
Pt voiced understanding

## 2021-01-29 NOTE — Telephone Encounter (Signed)
Pt wants to make sure it's ok that she uses her inhalers. She hasn't had to use them since being diagnosed w/ afib   Please call [phone]
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
## 2021-01-29 NOTE — Telephone Encounter (Signed)
Ok to use inhalers if needed   Dominga Ferry MD

## 2021-02-19 ENCOUNTER — Telehealth: Payer: Self-pay | Admitting: Cardiology

## 2021-02-19 NOTE — Telephone Encounter (Signed)
New Message    Can you put an order in for [facility] for patient to get her cholesterol blood work done there tomorrow

## 2021-02-19 NOTE — Telephone Encounter (Signed)
Lab orders already placed and routed to [facility] lab

## 2021-02-19 NOTE — Telephone Encounter (Signed)
Spoke to patient she needs order placed to have cholesterol checked 2/23 at [facility] in [place].Advised I will send message to Dr.Branch's nurse.

## 2021-02-27 DIAGNOSIS — N368 Other specified disorders of urethra: Secondary | ICD-10-CM | POA: Insufficient documentation

## 2021-03-12 ENCOUNTER — Ambulatory Visit (INDEPENDENT_AMBULATORY_CARE_PROVIDER_SITE_OTHER): Payer: Medicare Other | Admitting: Podiatry

## 2021-03-12 ENCOUNTER — Other Ambulatory Visit: Payer: Self-pay

## 2021-03-12 ENCOUNTER — Ambulatory Visit (INDEPENDENT_AMBULATORY_CARE_PROVIDER_SITE_OTHER): Payer: Medicare Other

## 2021-03-12 ENCOUNTER — Encounter: Payer: Self-pay | Admitting: Podiatry

## 2021-03-12 DIAGNOSIS — W19XXXA Unspecified fall, initial encounter: Secondary | ICD-10-CM

## 2021-03-12 DIAGNOSIS — M79671 Pain in right foot: Secondary | ICD-10-CM

## 2021-03-12 DIAGNOSIS — Y92009 Unspecified place in unspecified non-institutional (private) residence as the place of occurrence of the external cause: Secondary | ICD-10-CM | POA: Diagnosis not present

## 2021-03-12 DIAGNOSIS — M19171 Post-traumatic osteoarthritis, right ankle and foot: Secondary | ICD-10-CM

## 2021-03-12 DIAGNOSIS — M79672 Pain in left foot: Secondary | ICD-10-CM | POA: Diagnosis not present
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
## 2021-03-27 DIAGNOSIS — M40205 Unspecified kyphosis, thoracolumbar region: Secondary | ICD-10-CM | POA: Insufficient documentation

## 2021-05-20 ENCOUNTER — Telehealth: Payer: Self-pay | Admitting: Cardiology

## 2021-05-20 MED ORDER — ELIQUIS 5 MG PO TABS: ORAL_TABLET | ORAL | 0 refills | Status: DC

## 2021-05-20 NOTE — Telephone Encounter (Signed)
Pt is wanting to fill out another pt assistance form for Eliquis

## 2021-05-20 NOTE — Telephone Encounter (Signed)
Says she has met her 3% out of pocket expense to receive eliquis from [facility]. Says her pharmacy is faxing proof of 3% out of pocket expense to [facility]. Request eliquis samples. Advised samples are available for pick up.

## 2021-05-21 ENCOUNTER — Telehealth: Payer: Self-pay | Admitting: *Deleted

## 2021-05-21 NOTE — Telephone Encounter (Signed)
Received fax from [facility] - patient approved to receive Eliquis free of charge from 05/21/2021 through 12/28/2021.

## 2021-05-29 ENCOUNTER — Telehealth: Payer: Self-pay | Admitting: Podiatry

## 2021-05-29 NOTE — Telephone Encounter (Signed)
Pt left message stating she left a rx for diabetic shoes here months ago and has not heard anything and is asking getting the rx back to go some where else.  I am not sure who she left the rx with as she seen Dr Lilian Kapur and it says she seen Dr Eloy End the same day. I returned the call and left message that pt needs an appt to come in to be measured for the shoes and pick out a pair so we can get the needed documents.

## 2021-05-31 NOTE — Telephone Encounter (Signed)
Pt called and left message when I was on the phone with an insurance company and said she is getting a new brace so would need to start the process over.   I returned call and talked with pt and told her that once she gets the brace in we can get her in to pick out a new shoe and start the process over and as far as the prescription goes we would send the needed documents to the pcp. She stated she is no longer taking the metformin since she has lost 40 lbs. But she does still check her blood sugar.  I told her to give [place] a call and we will get her scheduled to come in once she has received the brace.

## 2021-05-31 NOTE — Telephone Encounter (Signed)
Pt returned my call and left message that she did pick out shoes last year but they did not work and we ordered another pair but she never heard back.   I returned call and left message apologizing but I was going to start the paperwork and order over on my end and wanted to make sure she is still wanting the same style shoe or if she wanted to look at the shoes again when she is here on 6.27 to see Dr Eloy End.

## 2021-06-03 NOTE — Telephone Encounter (Signed)
She saw Endocrinologist in April. They noted diabetes in PMH, but she is now off medication, so I'm thinking diet controlled now. You are correct. It's best to wait. Thank you.

## 2021-06-08 IMAGING — MR MR LUMBAR SPINE W/O CM
4 of 5 series · 25 of 48 positions shown · non-contrast
Comparison: MRI lumbar spine 03/20/2020

CLINICAL DATA: History of compression fracture.  Fall December 2019

EXAM:
MRI LUMBAR SPINE WITHOUT CONTRAST
TECHNIQUE: Multiplanar, multisequence MR imaging of the lumbar spine was
performed. No intravenous contrast was administered.

[Series 4: T1 · sagittal · 4.0mm · 0.55mm/px · 5 of 13 slices shown (1 of 2)]
[im 1/13]
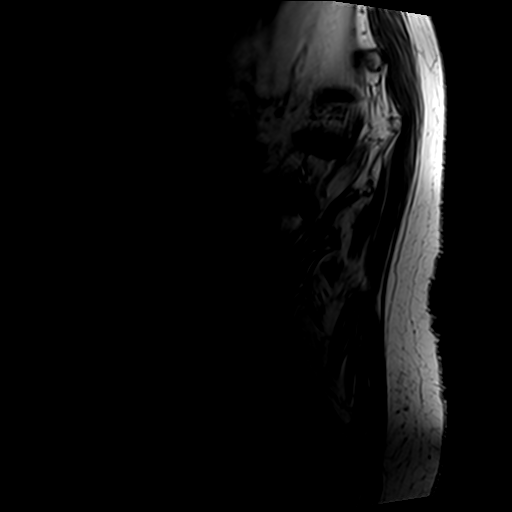
[im 4/13]
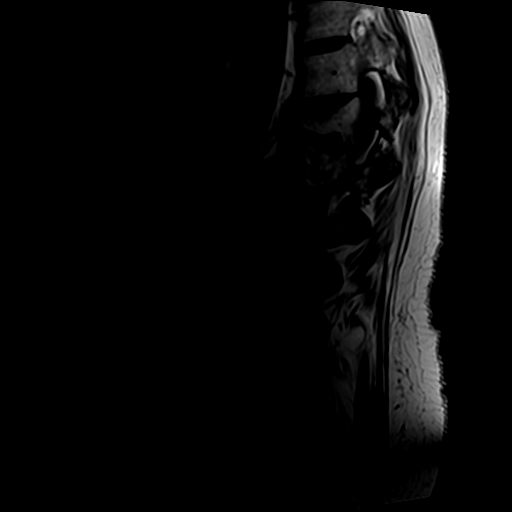
[im 7/13]
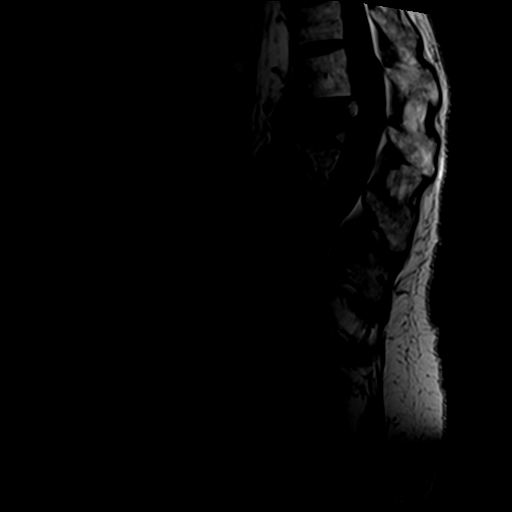
[im 10/13]
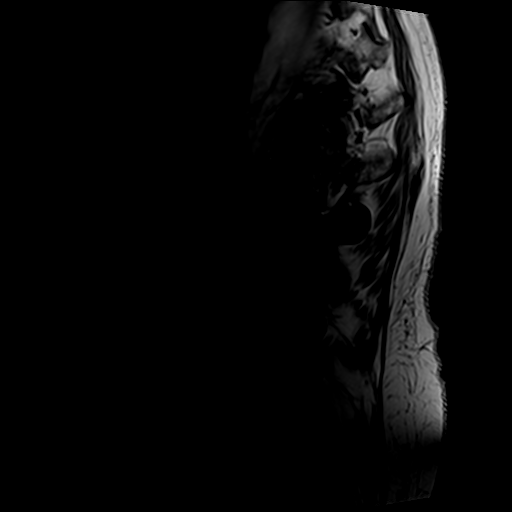
[im 13/13]
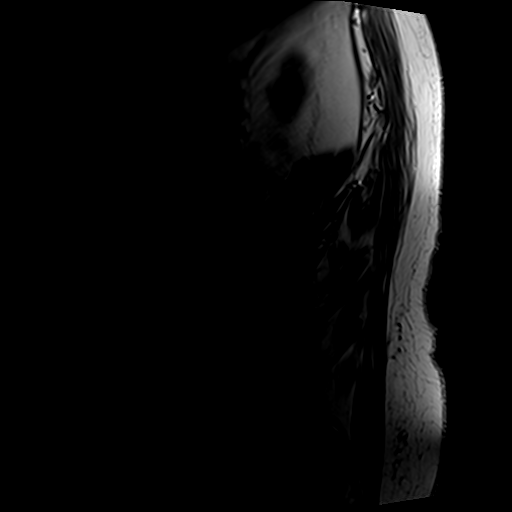

[Series 5: T2 post-contrast · sagittal · 4.0mm · 0.55mm/px · 5 of 13 slices shown]
[im 1/13]
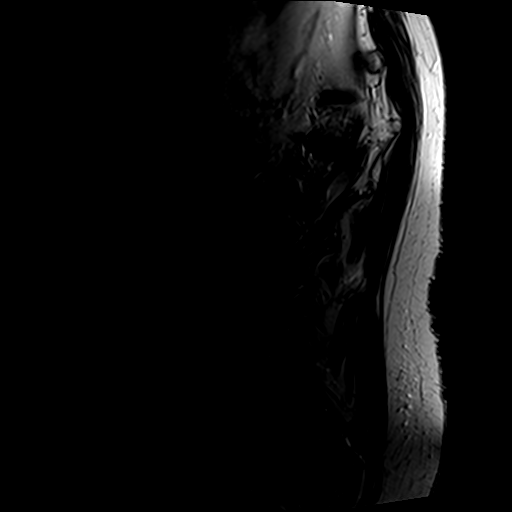
[im 4/13]
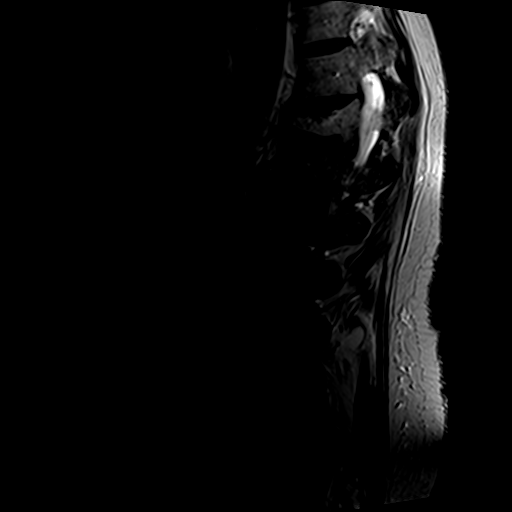
[im 7/13]
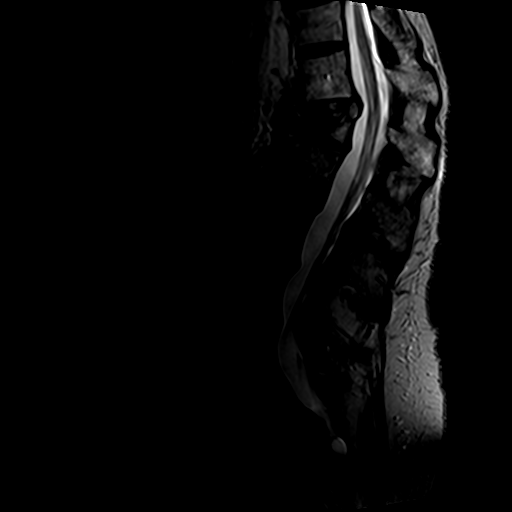
[im 10/13]
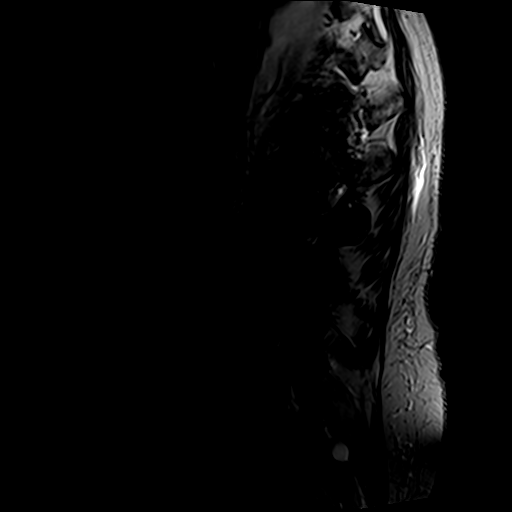
[im 13/13]
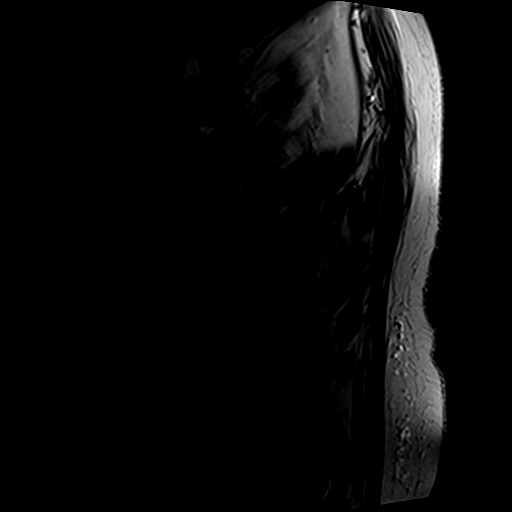

[Series 6: T2 · axial · 4.0mm · 0.70mm/px · z∈[-97,+113]mm · 10 of 42 slices shown]
[im 3/42]
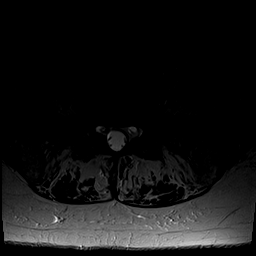
[im 6/42]
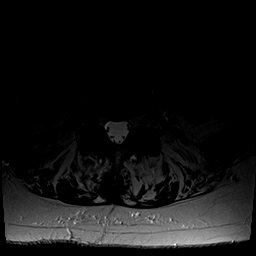
[im 9/42]
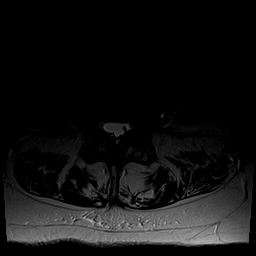
[im 14/42]
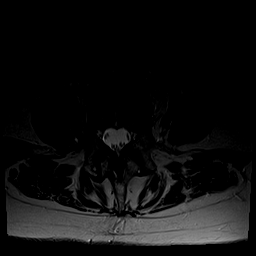
[im 20/42]
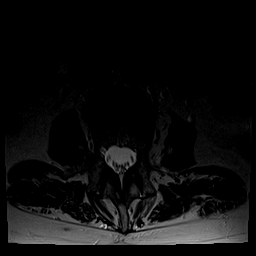
[im 22/42]
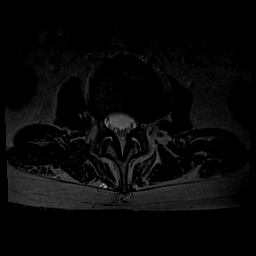
[im 25/42]
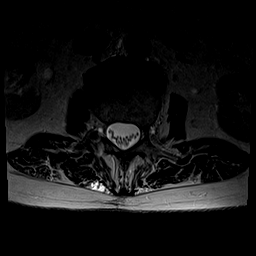
[im 31/42]
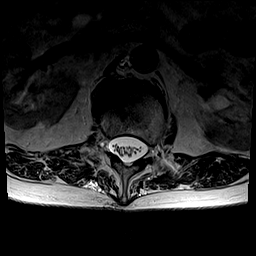
[im 36/42]
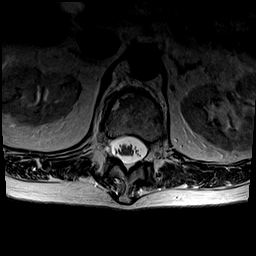
[im 42/42]
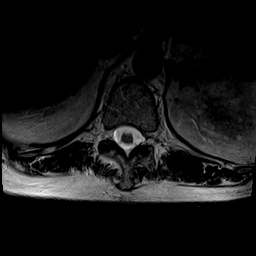

[Series 7: T1 · axial · 4.0mm · 0.35mm/px · z∈[-97,+82]mm · 5 of 42 slices shown (2 of 2)]
[im 3/42]
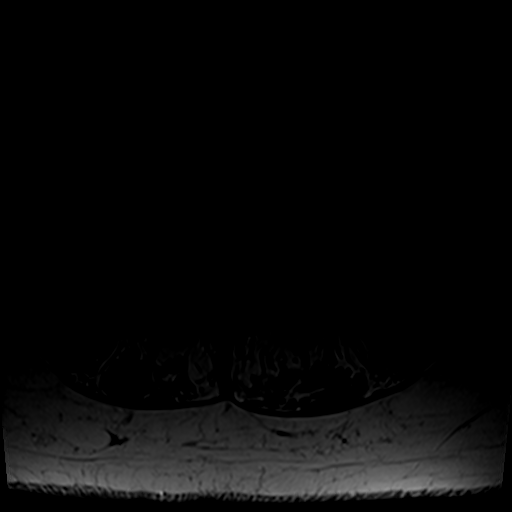
[im 6/42]
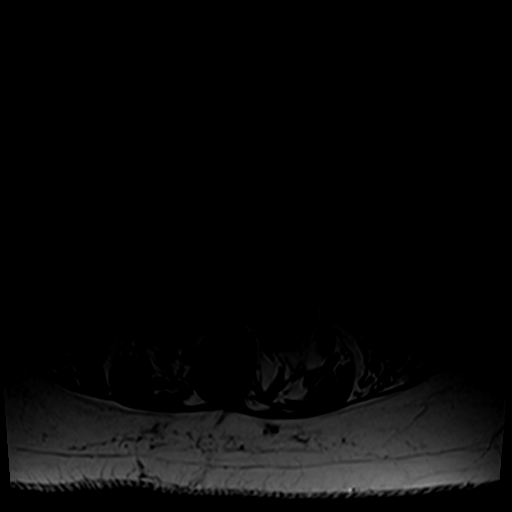
[im 9/42]
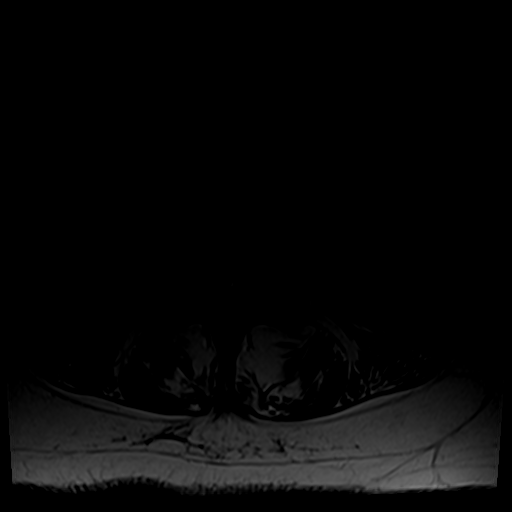
[im 22/42]
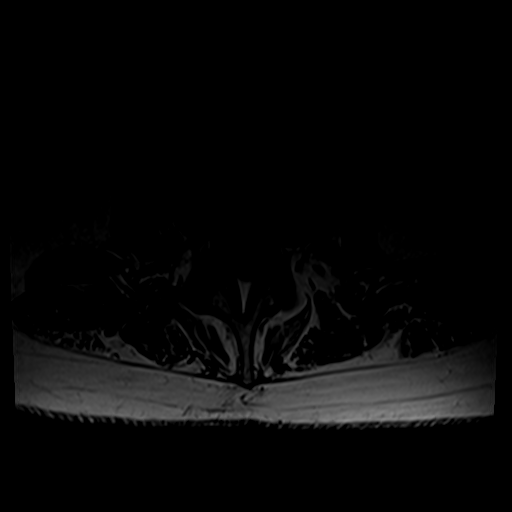
[im 36/42]
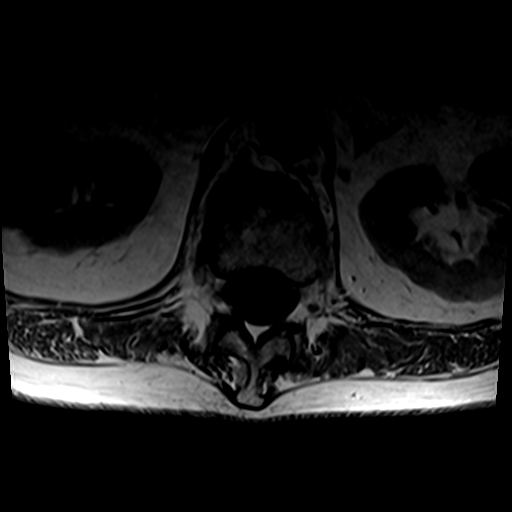

[25 of 48 positions shown; findings below may reference images not displayed]

FINDINGS: Segmentation:  Normal

Alignment:  Slight retrolisthesis L2-3 and L3-4.

Vertebrae: Moderate to severe compression fracture L1 vertebral
body. Fracture severity unchanged. Decreased bone marrow edema at L1
consistent with healing fracture. Mild retropulsion superior
endplate of L1 into the spinal canal, unchanged. No significant
spinal stenosis. Mild edema right T12 vertebral body likely due to
healing contusion. There was bone marrow edema in this region on the
prior study.

No new fracture or mass lesion identified.

Conus medullaris and cauda equina: Conus extends to the L1-2 level.
Conus and cauda equina appear normal.

Paraspinal and other soft tissues: Negative for paraspinous mass or
adenopathy.

Disc levels:

T12-L1: Mild retropulsion of the superior endplate of L1 into the
canal without significant stenosis.

L1-2: Mild disc degeneration.  Negative for stenosis

L2-3: Mild disc and mild facet degeneration.  Negative for stenosis.

L3-4: Mild disc bulging and mild facet degeneration. Negative for
stenosis.

L4-5: Diffuse bulging of the disc and moderate facet degeneration.
Mild spinal stenosis and mild subarticular stenosis bilaterally. No
interval change

L5-S1: Bilateral facet degeneration.  No significant stenosis.
IMPRESSION: Moderate to severe compression fracture L1 shows interval healing.

Mild edema at T12 vertebral body on the right compatible with
healing contusion. This was present previously.

No new fracture

Lumbar degenerative changes above. Mild spinal stenosis L4-5 with
mild subarticular stenosis bilaterally.

## 2021-06-10 ENCOUNTER — Encounter: Payer: Self-pay | Admitting: Cardiology

## 2021-06-10 ENCOUNTER — Ambulatory Visit (INDEPENDENT_AMBULATORY_CARE_PROVIDER_SITE_OTHER): Payer: Medicare Other | Admitting: Cardiology

## 2021-06-10 VITALS — BP 140/70 | HR 66 | Ht 64.0 in | Wt 220.0 lb

## 2021-06-10 DIAGNOSIS — R0602 Shortness of breath: Secondary | ICD-10-CM | POA: Diagnosis not present

## 2021-06-10 DIAGNOSIS — I4891 Unspecified atrial fibrillation: Secondary | ICD-10-CM | POA: Diagnosis not present

## 2021-06-10 DIAGNOSIS — I10 Essential (primary) hypertension: Secondary | ICD-10-CM | POA: Diagnosis not present

## 2021-06-10 DIAGNOSIS — R600 Localized edema: Secondary | ICD-10-CM | POA: Diagnosis not present

## 2021-06-10 MED ORDER — FUROSEMIDE 20 MG PO TABS: 20.0000 mg | ORAL_TABLET | ORAL | Status: DC | PRN

## 2021-06-10 NOTE — Patient Instructions (Addendum)
Medication Instructions:  Change your Lasix to 20mg  as needed only.  Continue all current medications.  Labwork: none  Testing/Procedures: Your physician has requested that you have an echocardiogram. Echocardiography is a painless test that uses sound waves to create images of your heart. It provides your doctor with information about the size and shape of your heart and how well your heart's chambers and valves are working. This procedure takes approximately one hour. There are no restrictions for this procedure. Office will contact with results via phone or letter.    Follow-Up: 6 months   Any Other Special Instructions Will Be Listed Below (If Applicable).  If you need a refill on your cardiac medications before your next appointment, please call your pharmacy.
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
## 2021-06-11 ENCOUNTER — Other Ambulatory Visit: Payer: Self-pay | Admitting: Cardiology

## 2021-06-11 ENCOUNTER — Encounter: Payer: Self-pay | Admitting: Podiatry

## 2021-06-11 ENCOUNTER — Other Ambulatory Visit: Payer: Self-pay

## 2021-06-11 ENCOUNTER — Ambulatory Visit (INDEPENDENT_AMBULATORY_CARE_PROVIDER_SITE_OTHER): Payer: Medicare Other | Admitting: Podiatry

## 2021-06-11 DIAGNOSIS — E1151 Type 2 diabetes mellitus with diabetic peripheral angiopathy without gangrene: Secondary | ICD-10-CM

## 2021-06-11 DIAGNOSIS — M79674 Pain in right toe(s): Secondary | ICD-10-CM | POA: Diagnosis not present

## 2021-06-11 DIAGNOSIS — B351 Tinea unguium: Secondary | ICD-10-CM | POA: Diagnosis not present

## 2021-06-11 DIAGNOSIS — M79675 Pain in left toe(s): Secondary | ICD-10-CM | POA: Diagnosis not present

## 2021-06-11 DIAGNOSIS — L84 Corns and callosities: Secondary | ICD-10-CM

## 2021-06-12 DIAGNOSIS — H5022 Vertical strabismus, left eye: Secondary | ICD-10-CM | POA: Insufficient documentation

## 2021-06-14 ENCOUNTER — Telehealth: Payer: Self-pay | Admitting: Cardiology

## 2021-06-14 MED ORDER — DILTIAZEM HCL 60 MG PO TABS: 60.0000 mg | ORAL_TABLET | Freq: Every day | ORAL | 3 refills | Status: DC

## 2021-06-14 MED ORDER — SOTALOL HCL 120 MG PO TABS: 120.0000 mg | ORAL_TABLET | Freq: Two times a day (BID) | ORAL | 3 refills | Status: DC

## 2021-06-14 NOTE — Telephone Encounter (Signed)
Done

## 2021-06-14 NOTE — Telephone Encounter (Signed)
*  STAT* If patient is at the pharmacy, call can be transferred to refill team.   1. Which medications need to be refilled? (please list name of each medication and dose if known) Sotalol 120 mg Diltiazem 60 mg    2. Which pharmacy/location (including street and city if local pharmacy) is medication to be sent to?[place]   3. Do they need a 30 day or 90 day supply? 90

## 2021-06-17 NOTE — Progress Notes (Signed)
  Subjective:  Patient ID: Veronica Harrison, female    DOB: [birth year],  MRN: [id number]  70 y.o. female presents with for at risk foot care. Patient has h/o PAD and callus(es) b/l and painful thick toenails that are difficult to trim. Painful toenails interfere with ambulation. Aggravating factors include wearing enclosed shoe gear. Pain is relieved with periodic professional debridement. Painful calluses are aggravated when weightbearing with and without shoegear. Pain is relieved with periodic professional debridement..    She will be fitted for a new brace at [facility] would like to defer her diabetic shoes for now.  Patient's blood sugar was 136 mg/dl today.  PCP: Garnette Gunner, MD and last visit was: May, 2022.  Review of Systems: Negative except as noted in the HPI.   Allergies  Allergen Reactions   Other Shortness Of Breath   Penicillins Anaphylaxis   Strawberry Extract Anaphylaxis   Ace Inhibitors Other (See Comments)    Feeling Faint    Gabapentin     Urinary incontinence    Pseudoephedrine Other (See Comments)    Makes heart race    Objective:  There were no vitals filed for this visit. Constitutional Patient is a pleasant 70 y.o. Caucasian female in NAD. AAO x 3.  Vascular Capillary refill time to digits immediate b/l. Faintly palpable DP pulse(s) b/l lower extremities. Nonpalpable PT pulse(s) b/l lower extremities. Pedal hair sparse. Lower extremity skin temperature gradient within normal limits. No pain with calf compression b/l. +1 pitting edema b/l lower extremities. Varicosities present b/l. No cyanosis or clubbing noted.  Neurologic Normal speech. Protective sensation intact 5/5 intact bilaterally with 10g monofilament b/l. Vibratory sensation intact b/l.  Dermatologic Pedal skin with normal turgor, texture and tone bilaterally. No open wounds bilaterally. No interdigital macerations bilaterally. Toenails 1-5 b/l elongated, discolored, dystrophic, thickened, crumbly  with subungual debris and tenderness to dorsal palpation. Hyperkeratotic lesion(s) R hallux, submet head 4 left foot, and submet head 5 left foot.  No erythema, no edema, no drainage, no fluctuance.  Orthopedic: Noted disuse atrophy b/l LE.    Assessment:   1. Pain due to onychomycosis of toenails of both feet   2. Callus   3. Type II diabetes mellitus with peripheral circulatory disorder [facility])    Plan:  Patient was evaluated and treated and all questions answered.  Onychomycosis with pain -Nails palliatively debridement as below. -Educated on self-care  Procedure: Nail Debridement Rationale: Pain Type of Debridement: manual, sharp debridement. Instrumentation: Nail nipper, rotary burr. Number of Nails: 10  -Examined patient. -Continue diabetic foot care principles. -We will wait until she received her new brace before we order her new diabetic shoes. -Patient to continue soft, supportive shoe gear daily. -Toenails 1-5 b/l were debrided in length and girth with sterile nail nippers and dremel without iatrogenic bleeding.  -Callus(es) R hallux, submet head 4 left foot, and submet head 5 left foot pared utilizing sterile scalpel blade without complication or incident. Total number debrided =3. -Patient to report any pedal injuries to medical professional immediately. -Patient/POA to call should there be question/concern in the interim.  Return in about 3 months (around 09/11/2021).  Freddie Breech, DPM

## 2021-06-24 ENCOUNTER — Ambulatory Visit: Payer: Medicare Other | Admitting: Podiatry

## 2021-07-09 ENCOUNTER — Other Ambulatory Visit: Payer: Self-pay | Admitting: Cardiology

## 2021-07-18 ENCOUNTER — Telehealth: Payer: Self-pay | Admitting: Cardiology

## 2021-07-18 DIAGNOSIS — I10 Essential (primary) hypertension: Secondary | ICD-10-CM

## 2021-07-18 NOTE — Telephone Encounter (Signed)
Pt was seen at PCP yesterday and her BP reading was 178/78. Dr. Janee Morn was concerned and wanted pt to Dr. Wyline Mood know.    Please call [phone]

## 2021-07-22 NOTE — Telephone Encounter (Signed)
BP was borderline at our visit, appears its trending up. Can she start losartan 25mg  daily, needs bmet in 2 weeks. If she has a home bp cuff could update on bp's in 2 weeks   [place] MD

## 2021-07-22 NOTE — Telephone Encounter (Signed)
No answer

## 2021-07-24 ENCOUNTER — Other Ambulatory Visit: Payer: Medicare Other

## 2021-07-24 MED ORDER — LOSARTAN POTASSIUM 25 MG PO TABS: 25.0000 mg | ORAL_TABLET | Freq: Every day | ORAL | 6 refills | Status: DC

## 2021-07-24 NOTE — Telephone Encounter (Signed)
Patient notified and verbalized understanding.  She agrees to begin new medication & sent to [facility] pharmacy now.  States that it may be Saturday before she can pick up.  She will do her lab at [facility] in 2 weeks - order mailed to patient's home.  She does have BP cuff at home - she will continue to monitor her BP & HR over the next 2 weeks and call office with readings.  Reminded to check reading 2 hours after her medication.  Patient verbalized understanding.

## 2021-08-02 ENCOUNTER — Other Ambulatory Visit: Payer: Self-pay

## 2021-08-02 ENCOUNTER — Ambulatory Visit (HOSPITAL_COMMUNITY)
Admission: RE | Admit: 2021-08-02 | Discharge: 2021-08-02 | Disposition: A | Discharge status: Home / Self Care | Payer: Medicare Other | Source: Ambulatory Visit | Attending: Cardiology | Admitting: Cardiology

## 2021-08-02 DIAGNOSIS — R0602 Shortness of breath: Secondary | ICD-10-CM | POA: Insufficient documentation

## 2021-08-02 LAB — ECHOCARDIOGRAM COMPLETE
Area-P 1/2: 4.46 cm2
S' Lateral: 2.7 cm

## 2021-08-02 NOTE — Progress Notes (Signed)
*  PRELIMINARY RESULTS* Echocardiogram 2D Echocardiogram has been performed.  Stacey Drain 08/02/2021, 1:55 PM

## 2021-08-06 ENCOUNTER — Other Ambulatory Visit: Payer: Self-pay | Admitting: Cardiology

## 2021-08-12 ENCOUNTER — Telehealth: Payer: Self-pay | Admitting: *Deleted

## 2021-08-12 NOTE — Telephone Encounter (Signed)
-----   Message from Antoine Poche, MD sent at 08/12/2021 12:20 PM EDT ----- Echo looks fine, overall normal heart function   J BrancH MD

## 2021-08-12 NOTE — Telephone Encounter (Signed)
Lesle Chris, LPN  4/31/5400  3:17 PM EDT Back to Top    Notified, copy to pcp.

## 2021-09-20 ENCOUNTER — Other Ambulatory Visit: Payer: Self-pay

## 2021-09-20 ENCOUNTER — Ambulatory Visit (INDEPENDENT_AMBULATORY_CARE_PROVIDER_SITE_OTHER): Payer: Medicare Other | Admitting: Podiatry

## 2021-09-20 ENCOUNTER — Encounter: Payer: Self-pay | Admitting: Podiatry

## 2021-09-20 DIAGNOSIS — I8393 Asymptomatic varicose veins of bilateral lower extremities: Secondary | ICD-10-CM | POA: Diagnosis not present

## 2021-09-20 DIAGNOSIS — B351 Tinea unguium: Secondary | ICD-10-CM

## 2021-09-20 DIAGNOSIS — M2041 Other hammer toe(s) (acquired), right foot: Secondary | ICD-10-CM

## 2021-09-20 DIAGNOSIS — M79675 Pain in left toe(s): Secondary | ICD-10-CM | POA: Diagnosis not present

## 2021-09-20 DIAGNOSIS — E1151 Type 2 diabetes mellitus with diabetic peripheral angiopathy without gangrene: Secondary | ICD-10-CM | POA: Diagnosis not present

## 2021-09-20 DIAGNOSIS — M2042 Other hammer toe(s) (acquired), left foot: Secondary | ICD-10-CM

## 2021-09-20 DIAGNOSIS — M79674 Pain in right toe(s): Secondary | ICD-10-CM

## 2021-09-20 DIAGNOSIS — L84 Corns and callosities: Secondary | ICD-10-CM | POA: Diagnosis not present
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
## 2021-09-24 ENCOUNTER — Other Ambulatory Visit: Payer: Medicare Other

## 2021-09-30 ENCOUNTER — Telehealth: Payer: Self-pay | Admitting: Cardiology

## 2021-09-30 DIAGNOSIS — I10 Essential (primary) hypertension: Secondary | ICD-10-CM

## 2021-09-30 NOTE — Telephone Encounter (Signed)
Sodium was 129 (135-145) on 09/19/2021 - done at [facility].  Left message to return call.

## 2021-09-30 NOTE — Telephone Encounter (Signed)
Patient is requesting call back about her medication. She stated she started Losartan and her sodium is low. She states she thinks it is the medication making her sodium low.

## 2021-10-02 NOTE — Telephone Encounter (Signed)
Pt is returning call for results 

## 2021-10-07 NOTE — Telephone Encounter (Signed)
Possible side effect to losartan but not that common. Has she been using her lasix recently? Any issues with recent vomiting or diarrhea? Any reason she would be dehydrated?   J Josiah Wojtaszek MD

## 2021-10-08 NOTE — Telephone Encounter (Signed)
Patient notified via detailed voice message - asked to call back tomorrow or respond via my chart.

## 2021-10-09 NOTE — Telephone Encounter (Signed)
No answer

## 2021-10-09 NOTE — Telephone Encounter (Signed)
Patient calling back - stating that she is not taking any Lasix, no vomiting or diarrhea.  Drinks about 64 oz water per day.  States that she really feels like it is the Losartan causing this.  States that she does have some odd reactions to medications.  Mentions that she could not take Gabapentin due to it causing her to have urinary retention & having to have a catheter x 4 mo's.  Never had any issues with low sodium in the past.

## 2021-10-09 NOTE — Telephone Encounter (Signed)
Can stop losartan, recheck bmet in 2 weeks. Hold on a replacement for now until we know for sure we are not going to restart losartan  Dominga Ferry MD

## 2021-10-10 ENCOUNTER — Other Ambulatory Visit: Payer: Self-pay

## 2021-10-10 ENCOUNTER — Ambulatory Visit (INDEPENDENT_AMBULATORY_CARE_PROVIDER_SITE_OTHER): Payer: Medicare Other | Admitting: *Deleted

## 2021-10-10 DIAGNOSIS — E1151 Type 2 diabetes mellitus with diabetic peripheral angiopathy without gangrene: Secondary | ICD-10-CM

## 2021-10-10 DIAGNOSIS — M2041 Other hammer toe(s) (acquired), right foot: Secondary | ICD-10-CM

## 2021-10-10 DIAGNOSIS — M2042 Other hammer toe(s) (acquired), left foot: Secondary | ICD-10-CM

## 2021-10-10 DIAGNOSIS — L84 Corns and callosities: Secondary | ICD-10-CM

## 2021-10-10 MED ORDER — LOSARTAN POTASSIUM 25 MG PO TABS: 25.0000 mg | ORAL_TABLET | Freq: Every day | ORAL | 6 refills | Status: DC

## 2021-10-10 NOTE — Telephone Encounter (Signed)
Patient informed and verbalized understanding of plan. Lab order faxed to [facility] °

## 2021-10-10 NOTE — Progress Notes (Signed)
Patient presents to the office today for diabetic shoe and insole measuring.  Patient was measured with brannock device to determine size and width for 1 pair of extra depth shoes and foam casted for 3 pair of insoles.   Documentation of medical necessity will be sent to patient's treating diabetic doctor to verify and sign.   Patient's diabetic provider: Dr. Fanny Bien  Shoes and insoles will be ordered at that time and patient will be notified for an appointment for fitting when they arrive.   Shoe size (per patient): 10-10.5   Brannock measurement: RIGHT/LEFT - 9 B  Patient shoe selection-   1st choice:   Apex [id number]  2nd choice:  N/A  Shoe size ordered: Women's 9.5 X-Wide  ~Dr. Eloy End wanted patient to get this shoe to accommodate swelling and AFO brace.

## 2021-10-10 NOTE — Telephone Encounter (Signed)
New message    Patient called again said she is out of her BP medication. She needs a decision made on if she is to change meds because its effecting her kidneys

## 2021-10-10 NOTE — Addendum Note (Signed)
Addended by: Eustace Moore on: 10/10/2021 02:58 PM   Modules accepted: Orders

## 2021-10-24 ENCOUNTER — Other Ambulatory Visit: Payer: Self-pay | Admitting: *Deleted

## 2021-10-24 DIAGNOSIS — I8390 Asymptomatic varicose veins of unspecified lower extremity: Secondary | ICD-10-CM
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
## 2021-10-29 ENCOUNTER — Ambulatory Visit (INDEPENDENT_AMBULATORY_CARE_PROVIDER_SITE_OTHER): Payer: Medicare Other | Admitting: Vascular Surgery

## 2021-10-29 ENCOUNTER — Ambulatory Visit (HOSPITAL_COMMUNITY)
Admission: RE | Admit: 2021-10-29 | Discharge: 2021-10-29 | Disposition: A | Discharge status: Home / Self Care | Payer: Medicare Other | Source: Ambulatory Visit | Attending: Vascular Surgery | Admitting: Vascular Surgery

## 2021-10-29 ENCOUNTER — Other Ambulatory Visit: Payer: Self-pay

## 2021-10-29 VITALS — BP 135/67 | HR 60 | Temp 98.3°F | Resp 20 | Ht 64.0 in | Wt 220.0 lb

## 2021-10-29 DIAGNOSIS — I872 Venous insufficiency (chronic) (peripheral): Secondary | ICD-10-CM

## 2021-10-29 DIAGNOSIS — I8390 Asymptomatic varicose veins of unspecified lower extremity: Secondary | ICD-10-CM | POA: Diagnosis not present

## 2021-10-29 DIAGNOSIS — I8392 Asymptomatic varicose veins of left lower extremity: Secondary | ICD-10-CM | POA: Diagnosis not present

## 2021-10-30 ENCOUNTER — Telehealth: Payer: Self-pay | Admitting: Cardiology

## 2021-10-30 ENCOUNTER — Telehealth: Payer: Self-pay

## 2021-10-30 NOTE — Telephone Encounter (Signed)
Left message to return call 

## 2021-10-30 NOTE — Telephone Encounter (Signed)
Patient states she had labs done and her sodium is still low and her BP is still high. She says she would also like to know if she has permanent kidney damage from the losartan.

## 2021-10-30 NOTE — Telephone Encounter (Signed)
I called pt to let her know I tried to get pre-auth. For sclerotherapy and was told Medicare supplemental policies do not require pre-cert. I explained this to pt and she is going to think about it and will call back if she decides to schedule. No further questions/concerns at this time.

## 2021-10-31 NOTE — Telephone Encounter (Signed)
Pt is returning call.  

## 2021-10-31 NOTE — Telephone Encounter (Signed)
Left message to return call 

## 2021-10-31 NOTE — Telephone Encounter (Signed)
Patient c/o blood pressure staying elevated.    10/29 - 147/74  63 - 8:30 pm 10/30 - 154/87  66 - 8:15 pm /  164/81  62 - 11:00 pm 10/31 - 164/78  75 - 6:30 pm 11/2 - 153/85 - 10:30 pm  States that she has had a 50 lb weight gain over the last 4 months.  She is following with endocrinology as well due to thyroid issues.    Weight today is 220lb.  2 days ago - 218lb.    States that she continues to have swelling in her legs along with cramps.  She thinks this is related to her sodium level.  She has not taken any of her as needed Lasix because she thinks it is adding to her low sodium.    Last labs dated 10/23/2021 (see care everywhere) shows her potassium at 132 with normal creatinine.    She also states that she is more SOB over the last 1-2 months & sleeping on 2 pillows.  No c/o chest pain.    Next OV is 12/12/2021 with you.

## 2021-10-31 NOTE — Telephone Encounter (Signed)
A lot to try to sort out, can she see me earlier Nov 18 at noon  Dominga Ferry MD

## 2021-10-31 NOTE — Telephone Encounter (Signed)
Patient notified and verbalized understanding.  She agrees to the appointment.

## 2021-11-04 ENCOUNTER — Other Ambulatory Visit: Payer: Self-pay | Admitting: Cardiology

## 2021-11-06 ENCOUNTER — Encounter: Payer: Self-pay | Admitting: Dermatology

## 2021-11-06 ENCOUNTER — Other Ambulatory Visit: Payer: Self-pay

## 2021-11-06 ENCOUNTER — Ambulatory Visit (INDEPENDENT_AMBULATORY_CARE_PROVIDER_SITE_OTHER): Payer: Medicare Other | Admitting: Dermatology

## 2021-11-06 DIAGNOSIS — Z5181 Encounter for therapeutic drug level monitoring: Secondary | ICD-10-CM

## 2021-11-06 DIAGNOSIS — L409 Psoriasis, unspecified: Secondary | ICD-10-CM | POA: Diagnosis not present

## 2021-11-09 LAB — QUANTIFERON-TB GOLD PLUS
Mitogen-NIL: 4.61 IU/mL
NIL: 0.04 IU/mL
QuantiFERON-TB Gold Plus: NEGATIVE
TB1-NIL: 0 IU/mL
TB2-NIL: 0 IU/mL

## 2021-11-11 ENCOUNTER — Telehealth: Payer: Self-pay

## 2021-11-11 MED ORDER — SKYRIZI (150 MG DOSE) 75 MG/0.83ML ~~LOC~~ PSKT: PREFILLED_SYRINGE | SUBCUTANEOUS | 4 refills | Status: DC

## 2021-11-11 NOTE — Telephone Encounter (Signed)
Tb negative refills ok

## 2021-11-15 ENCOUNTER — Ambulatory Visit (INDEPENDENT_AMBULATORY_CARE_PROVIDER_SITE_OTHER): Payer: Medicare Other | Admitting: Cardiology

## 2021-11-15 ENCOUNTER — Encounter: Payer: Self-pay | Admitting: Cardiology

## 2021-11-15 ENCOUNTER — Telehealth: Payer: Self-pay | Admitting: Cardiology

## 2021-11-15 ENCOUNTER — Telehealth: Payer: Self-pay

## 2021-11-15 VITALS — BP 144/70 | HR 71 | Ht 64.0 in | Wt 224.0 lb

## 2021-11-15 DIAGNOSIS — I4891 Unspecified atrial fibrillation: Secondary | ICD-10-CM | POA: Diagnosis not present

## 2021-11-15 DIAGNOSIS — R600 Localized edema: Secondary | ICD-10-CM

## 2021-11-15 DIAGNOSIS — I10 Essential (primary) hypertension: Secondary | ICD-10-CM

## 2021-11-15 MED ORDER — APIXABAN 5 MG PO TABS: ORAL_TABLET | ORAL | 5 refills | Status: DC

## 2021-11-15 MED ORDER — HYDRALAZINE HCL 25 MG PO TABS: 25.0000 mg | ORAL_TABLET | Freq: Two times a day (BID) | ORAL | 3 refills | Status: DC

## 2021-11-15 MED ORDER — FUROSEMIDE 20 MG PO TABS: 20.0000 mg | ORAL_TABLET | ORAL | Status: DC | PRN

## 2021-11-15 MED ORDER — DILTIAZEM HCL 30 MG PO TABS
ORAL_TABLET | ORAL | 3 refills | Status: DC
Start: 2021-11-15 — End: 2022-03-20

## 2021-11-15 MED ORDER — FUROSEMIDE 20 MG PO TABS: 20.0000 mg | ORAL_TABLET | Freq: Every day | ORAL | 6 refills | Status: DC | PRN

## 2021-11-15 NOTE — Telephone Encounter (Signed)
Pt needs a refill on Eliquis per Dr. Wyline Mood.

## 2021-11-15 NOTE — Patient Instructions (Signed)
Medication Instructions:  Your physician has recommended you make the following change in your medication:  STOP Losartan  START Hydralazin 25 mg tablets twice daily TAKE Lasix 20 mg tablets daily for 3 days, then go back to as needed.    Labwork: None  Testing/Procedures: None  Follow-Up: 4 months with Dr. Wyline Mood  Any Other Special Instructions Will Be Listed Below (If Applicable).  Call our office on Tuesday with updates on: Weight,  Blood Pressure, Swelling, and Breathing   If you need a refill on your cardiac medications before your next appointment, please call your pharmacy.

## 2021-11-15 NOTE — Telephone Encounter (Signed)
Prescription refill request for Eliquis received.  Indication: Afib  Last office visit: [place], 11/15/2021 Scr: 0.77, 10/23/2021 Age: 70 yo  Weight:  101.6 kg   Refill sent.

## 2021-11-15 NOTE — Telephone Encounter (Signed)
Please send refill for furosemide (LASIX) 20 MG tablet [[id number]]  to [facility]- pt has been waiting and they have no received the refill yet

## 2021-11-15 NOTE — Addendum Note (Signed)
Addended by: Eustace Moore on: 11/15/2021 02:12 PM   Modules accepted: Orders
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
## 2021-11-19 ENCOUNTER — Telehealth: Payer: Self-pay | Admitting: Cardiology

## 2021-11-19 NOTE — Telephone Encounter (Signed)
BP readings/ Weight Loss  Total weight loss of 3 lbs from 11/16/21 to today. Pt feels exhausted BP   172/80 p 80 MORNING 11/16/21 140/96  EVENING  11/16/21  172/96 P 83 11/17/21 136/76 P 63 11/17/21  152/79 P 81 11/18/21 149/79 P 68 11/18/21

## 2021-11-19 NOTE — Telephone Encounter (Signed)
Please increase hydralazine to 50mg  bid. Has her breathing improved at all with the lasix?  MD

## 2021-11-20 MED ORDER — HYDRALAZINE HCL 50 MG PO TABS: 50.0000 mg | ORAL_TABLET | Freq: Two times a day (BID) | ORAL | 1 refills | Status: DC

## 2021-11-20 NOTE — Telephone Encounter (Signed)
Patient informed and verbalized understanding of plan. Reports breathing has improved

## 2021-11-25 ENCOUNTER — Telehealth: Payer: Self-pay | Admitting: Dermatology

## 2021-11-25 ENCOUNTER — Encounter: Payer: Self-pay | Admitting: Dermatology

## 2021-11-25 DIAGNOSIS — L409 Psoriasis, unspecified: Secondary | ICD-10-CM

## 2021-11-25 MED ORDER — SKYRIZI (150 MG DOSE) 75 MG/0.83ML ~~LOC~~ PSKT: PREFILLED_SYRINGE | SUBCUTANEOUS | 4 refills | Status: DC

## 2021-11-25 NOTE — Telephone Encounter (Addendum)
Patient had TB test done and is calling to get refill on Skyrizi and also patient would like a copy of TB test results.  Patient would like a return telephone call.

## 2021-11-25 NOTE — Telephone Encounter (Signed)
Patient states that she needs her prescription by 12/10/2021 and the number for her prescription renewal at [facility] is [phone].

## 2021-11-25 NOTE — Progress Notes (Signed)
   Follow-Up Visit   Subjective  Veronica Harrison is a 70 y.o. female who presents for the following: Annual Exam (Elbows and left hip flare, patient stated she gets TB test at [facility] ).  Psoriasis Location:  Duration:  Quality:  Associated Signs/Symptoms: Modifying Factors:  Severity:  Timing: Context:   Objective  Well appearing patient in no apparent distress; mood and affect are within normal limits. Left Thigh - Anterior Skin generally 90+ percent clear, few small plaques on extremities, tolerating the Skyrizi, does still have significant joint symptoms but uncertain if it is related to her psoriasis.  She request to continue the [employer].    A focused examination was performed including head, neck, arms, elbows, hand joints.. Relevant physical exam findings are noted in the Assessment and Plan.   Assessment & Plan    Psoriasis Left Thigh - Anterior  I will update her TB status.  All potential risks reviewed along with an update of alternative therapies.  If all is well recheck 1 year.  Related Procedures QuantiFERON-TB Gold Plus  Encounter for therapeutic drug monitoring  Related Procedures QuantiFERON-TB Gold Plus      I, Janalyn Harder, MD, have reviewed all documentation for this visit.  The documentation on 11/25/21 for the exam, diagnosis, procedures, and orders are all accurate and complete.

## 2021-11-26 ENCOUNTER — Telehealth: Payer: Self-pay | Admitting: Podiatry

## 2021-11-26 NOTE — Telephone Encounter (Signed)
Pharmacy calling skyrizi prescription directions stated injected every 21 days. Verbal given to pharmacist that states patient is to inject skyrizi every 12 weeks. Pharmacy will call patient to scheduled delivery.

## 2021-11-26 NOTE — Telephone Encounter (Signed)
Diabetic shoes/inserts in..lvm for pt to call to schedule an appt to pick them up. 

## 2021-11-29 ENCOUNTER — Other Ambulatory Visit: Payer: Self-pay

## 2021-11-29 ENCOUNTER — Ambulatory Visit: Payer: Medicare Other

## 2021-11-29 DIAGNOSIS — E1151 Type 2 diabetes mellitus with diabetic peripheral angiopathy without gangrene: Secondary | ICD-10-CM

## 2021-12-02 NOTE — Progress Notes (Signed)
Attempted to fit A5514 and A5500 diabetic shoes and inserts. Inserts and shoes were inappropriate and patient reported excessive tightness and discomfort. Will re-order and refabricate as [phone] style foot orthoses with less arch

## 2021-12-12 ENCOUNTER — Ambulatory Visit: Payer: Medicare Other | Admitting: Cardiology

## 2021-12-13 ENCOUNTER — Telehealth: Payer: Self-pay | Admitting: Cardiology

## 2021-12-13 NOTE — Telephone Encounter (Signed)
Patient states she is in afib and wants to know if she can take her inhaler. Please advise

## 2021-12-16 NOTE — Telephone Encounter (Signed)
No reason she cannot use her inhalers. How does she know she is in afib, did she have a recent EKG at another location or just based on symptoms?   Dominga Ferry MD

## 2021-12-17 NOTE — Telephone Encounter (Signed)
Patient informed and verbalized understanding of plan.  Reports she had 1 episode of a-fib on 12/13/21 that lasted 3 hours Says she did use her inhaler last night

## 2021-12-31 ENCOUNTER — Telehealth: Payer: Self-pay | Admitting: Podiatry

## 2021-12-31 NOTE — Telephone Encounter (Signed)
Pts diabetic shoes/inserts are in...   Upon checking pt has appt tomorrow 1.4.2023.@ 245. I have scheduled her to see Aaron Edelman as well. I called pt to inform her and her voicemail is full.

## 2022-01-01 ENCOUNTER — Ambulatory Visit (INDEPENDENT_AMBULATORY_CARE_PROVIDER_SITE_OTHER): Payer: Medicare Other | Admitting: Podiatry

## 2022-01-01 ENCOUNTER — Other Ambulatory Visit: Payer: Self-pay

## 2022-01-01 ENCOUNTER — Ambulatory Visit: Payer: Medicare Other

## 2022-01-01 DIAGNOSIS — M79675 Pain in left toe(s): Secondary | ICD-10-CM | POA: Diagnosis not present

## 2022-01-01 DIAGNOSIS — B351 Tinea unguium: Secondary | ICD-10-CM

## 2022-01-01 DIAGNOSIS — M79674 Pain in right toe(s): Secondary | ICD-10-CM | POA: Diagnosis not present

## 2022-01-01 DIAGNOSIS — L84 Corns and callosities: Secondary | ICD-10-CM | POA: Diagnosis not present

## 2022-01-01 DIAGNOSIS — M2042 Other hammer toe(s) (acquired), left foot: Secondary | ICD-10-CM

## 2022-01-01 DIAGNOSIS — E119 Type 2 diabetes mellitus without complications: Secondary | ICD-10-CM

## 2022-01-01 DIAGNOSIS — E1151 Type 2 diabetes mellitus with diabetic peripheral angiopathy without gangrene: Secondary | ICD-10-CM

## 2022-01-01 DIAGNOSIS — M2041 Other hammer toe(s) (acquired), right foot: Secondary | ICD-10-CM | POA: Diagnosis not present
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
## 2022-01-05 ENCOUNTER — Encounter: Payer: Self-pay | Admitting: Podiatry

## 2022-01-13 ENCOUNTER — Telehealth: Payer: Self-pay | Admitting: Cardiology

## 2022-01-13 NOTE — Telephone Encounter (Signed)
Pt c/o medication issue:  1. Name of Medication: hydrALAZINE (APRESOLINE) 50 MG tablet  2. How are you currently taking this medication (dosage and times per day)? 1 tablet twice a day  3. Are you having a reaction (difficulty breathing--STAT)? Yes  4. What is your medication issue? Patient states she has swelling in feet and some difficulty breathing. Difficult to hear patient on the line so her husband is answering for her. Attempted to contact nurses and was unsuccessful. Routing note on high priority. Phone: [phone], or [phone]

## 2022-01-14 NOTE — Telephone Encounter (Signed)
Agree with your assessment and plan  J Jessicah Croll MD 

## 2022-01-14 NOTE — Telephone Encounter (Signed)
Follow Up:     Patient says her blood pressure is running high.   Pt c/o BP issue: STAT if pt c/o blurred vision, one-sided weakness or slurred speech  1. What are your last 5 BP readings? Today  before she took her medicine it was 189/88- after she took her medicine it was  142/65- Yesterday 138/81, 01-12-22- 150/71 pulse 61 172/74 01-10-22  153/75, 01-11-22  139/63  154/70 pulse 68  2. Are you having any other symptoms (ex. Dizziness, headache, blurred vision, passed out)?  blurred vision anda little dizziness- feet swelling and wheezing  3. What is your BP issue? Blood pressure  running high

## 2022-01-14 NOTE — Telephone Encounter (Signed)
Reports wheezing during the day that worse at night Advised to contact PCP regarding wheezing Verbalized understanding  Restarted lasix on Saturday for swelling in feet after not taking it for 2 weeks due to respiratory symptoms Lost 3 lbs since taking lasix on Saturday Advised to contact us on Thursday if the swelling hasn't improved and weight doesn't continue to decrease Verbalized understanding  Reports missing BP medicine last night and BP was elevated this morning Also reports checking BP as soon as she sit down Advised to regularly monitor and record your blood pressure readings at home using the same machine at the same time of day, waiting at least 5-10 minutes after sitting before she checking BP, record readings and contact our office in 2 weeks with results Verbalized understanding of plan

## 2022-01-17 ENCOUNTER — Ambulatory Visit: Payer: Medicare Other

## 2022-01-21 ENCOUNTER — Telehealth: Payer: Self-pay | Admitting: Dermatology

## 2022-01-21 NOTE — Telephone Encounter (Signed)
Patient left message on office voice mail asking if [facility] had received the papers on [employer] she sent around 12/29/2021.

## 2022-01-21 NOTE — Telephone Encounter (Signed)
Phone call to patient to inform her that we haven't received any paperwork from her. Patient aware, she states she'll contact [employer] on Monday to see if they will send the paperwork to [place].

## 2022-01-30 ENCOUNTER — Telehealth: Payer: Self-pay | Admitting: Dermatology

## 2022-01-30 NOTE — Telephone Encounter (Signed)
She said [employer] hasn't gotten our portion of whatever is necessary for her to get the med. She says she's due for shot next month

## 2022-01-30 NOTE — Telephone Encounter (Signed)
[phone] [employer] complete number they will fax over the page 2 her medication enrolment form expired 12/28/21

## 2022-02-03 NOTE — Telephone Encounter (Signed)
[employer] applications form completed and faxed to [employer] @ [phone].

## 2022-02-05 ENCOUNTER — Telehealth: Payer: Self-pay

## 2022-02-05 NOTE — Telephone Encounter (Signed)
Phone call to [facility] to see if their needing any additional information from [place]. Per Willis Modena with [facility] they have the patient's application and there's no other information they need from [place].

## 2022-02-07 ENCOUNTER — Ambulatory Visit: Payer: Medicare Other | Admitting: Podiatry

## 2022-02-10 ENCOUNTER — Telehealth: Payer: Self-pay

## 2022-02-10 NOTE — Telephone Encounter (Signed)
Fax received from [facility] stating that they received a prescription for the [employer] Pen and per the patient's history she's been getting the syringes. [facility] needed clarification sent back with the correct dosage form.  Information corrected and faxed back to [facility] @ [phone].

## 2022-02-10 NOTE — Telephone Encounter (Signed)
Fax received from [facility] stating that the patient has been approved for patient assistance through 12/28/2022.

## 2022-03-05 ENCOUNTER — Other Ambulatory Visit: Payer: Self-pay

## 2022-03-05 ENCOUNTER — Ambulatory Visit (INDEPENDENT_AMBULATORY_CARE_PROVIDER_SITE_OTHER): Payer: Medicare Other | Admitting: Podiatry

## 2022-03-05 ENCOUNTER — Encounter: Payer: Self-pay | Admitting: Podiatry

## 2022-03-05 DIAGNOSIS — Z92241 Personal history of systemic steroid therapy: Secondary | ICD-10-CM | POA: Insufficient documentation

## 2022-03-05 DIAGNOSIS — M79674 Pain in right toe(s): Secondary | ICD-10-CM

## 2022-03-05 DIAGNOSIS — L84 Corns and callosities: Secondary | ICD-10-CM

## 2022-03-05 DIAGNOSIS — M79675 Pain in left toe(s): Secondary | ICD-10-CM | POA: Diagnosis not present

## 2022-03-05 DIAGNOSIS — E1151 Type 2 diabetes mellitus with diabetic peripheral angiopathy without gangrene: Secondary | ICD-10-CM | POA: Diagnosis not present

## 2022-03-05 DIAGNOSIS — E1165 Type 2 diabetes mellitus with hyperglycemia: Secondary | ICD-10-CM | POA: Insufficient documentation

## 2022-03-05 DIAGNOSIS — E242 Drug-induced Cushing's syndrome: Secondary | ICD-10-CM | POA: Insufficient documentation

## 2022-03-05 DIAGNOSIS — E890 Postprocedural hypothyroidism: Secondary | ICD-10-CM | POA: Insufficient documentation

## 2022-03-05 DIAGNOSIS — B351 Tinea unguium: Secondary | ICD-10-CM

## 2022-03-11 NOTE — Progress Notes (Signed)
?  Subjective:  ?Patient ID: Veronica Harrison, female    DOB: [birth year],  MRN: [id number] ? ?Veronica Harrison presents to clinic today for at risk foot care. Pt has h/o NIDDM with PAD and callus(es) left foot and painful thick toenails that are difficult to trim. Painful toenails interfere with ambulation. Aggravating factors include wearing enclosed shoe gear. Pain is relieved with periodic professional debridement. Painful calluses are aggravated when weightbearing with and without shoegear. Pain is relieved with periodic professional debridement. ? ?Patient states blood glucose was 139 mg/dl yesterday.  Patient did not check blood glucose today. ? ?New problem(s): None.  ? ?She would like to know the status of her diabetic shoes. ? ?PCP is Bonnita Hollow, MD , and last visit was February 03, 2022. ? ?Allergies  ?Allergen Reactions  ? Other Shortness Of Breath  ? Penicillins Anaphylaxis  ? Strawberry Extract Anaphylaxis  ? Ace Inhibitors Other (See Comments)  ?  Feeling Faint  ?Other reaction(s): Pass out  ? Gabapentin   ?  Urinary incontinence  ?Other reaction(s): urinary retention  ? Pseudoephedrine Other (See Comments)  ?  Makes heart race  ?Review of Systems: Negative except as noted in the HPI. ? ?Objective: No changes noted in today's physical examination. ?Veronica Harrison is a pleasant 71 y.o. female in NAD. AAO X 3. ? ?Vascular Examination: ?CFT <3 seconds b/l LE. Faintly palpable DP pulses b/l LE. Diminished PT pulse(s) b/l LE. Pedal hair sparse. No pain with calf compression b/l. Nonpitting edema noted b/l lower extremities. Varicosities present b/l. No ischemia or gangrene noted b/l LE. No cyanosis or clubbing noted b/l LE. ? ?Dermatological Examination: ?Pedal skin thin, shiny and atrophic b/l LE. Well healed scars/surgical scars b/l LE. No open wounds b/l LE. No interdigital macerations noted b/l LE. Toenails 1-5 b/l elongated, discolored, dystrophic, thickened, crumbly with subungual debris  and tenderness to dorsal palpation. Hyperkeratotic lesion(s) R hallux, submet head 4 left foot, and submet head 5 left foot.  No erythema, no edema, no drainage, no fluctuance. ? ?Musculoskeletal Examination: ?Noted disuse atrophy bilaterally. Hammertoe deformity noted 2-5 b/l. Plantar fat pad atrophy of forefoot area b/l lower extremities. Utilizes cane for ambulation assistance. Diabetic shoes do not accommodate her ankle edema and will not be dispensed. ? ?Neurological Examination: ?Protective sensation intact 5/5 intact bilaterally with 10g monofilament b/l. Vibratory sensation intact b/l. ? ?Assessment/Plan: ?1. Pain due to onychomycosis of toenails of both feet   ?2. Callus   ?3. Type II diabetes mellitus with peripheral circulatory disorder (HCC)   ?  ?-Examined patient. ?-Toenails 1-5 b/l were debrided in length and girth with sterile nail nippers and dremel without iatrogenic bleeding.  ?-Callus(es) submet head 4 left foot and submet head 5 left foot pared utilizing sterile scalpel blade without complication or incident. Total number debrided =2. ?-Patient/POA to call should there be question/concern in the interim.  ? ?Return in about 9 weeks (around 05/07/2022). ? ?Marzetta Board, DPM  ?

## 2022-03-12 ENCOUNTER — Ambulatory Visit: Payer: Medicare Other | Admitting: Podiatry

## 2022-03-14 ENCOUNTER — Telehealth: Payer: Self-pay

## 2022-03-14 NOTE — Telephone Encounter (Signed)
Needs order for Diabetic Shoes faxed to them again at [phone] attn Andi

## 2022-03-19 ENCOUNTER — Encounter (INDEPENDENT_AMBULATORY_CARE_PROVIDER_SITE_OTHER): Payer: Self-pay | Admitting: *Deleted

## 2022-03-19 NOTE — Telephone Encounter (Signed)
Faxed

## 2022-03-20 ENCOUNTER — Encounter: Payer: Self-pay | Admitting: Cardiology

## 2022-03-20 ENCOUNTER — Ambulatory Visit (INDEPENDENT_AMBULATORY_CARE_PROVIDER_SITE_OTHER): Payer: Medicare Other | Admitting: Cardiology

## 2022-03-20 VITALS — BP 140/60 | HR 72 | Ht 64.0 in | Wt 216.0 lb

## 2022-03-20 DIAGNOSIS — D6869 Other thrombophilia: Secondary | ICD-10-CM | POA: Diagnosis not present

## 2022-03-20 DIAGNOSIS — I10 Essential (primary) hypertension: Secondary | ICD-10-CM

## 2022-03-20 DIAGNOSIS — I4891 Unspecified atrial fibrillation: Secondary | ICD-10-CM | POA: Diagnosis not present

## 2022-03-20 DIAGNOSIS — R600 Localized edema: Secondary | ICD-10-CM | POA: Diagnosis not present

## 2022-03-20 MED ORDER — SOTALOL HCL 120 MG PO TABS: 120.0000 mg | ORAL_TABLET | Freq: Two times a day (BID) | ORAL | 6 refills | Status: DC

## 2022-03-20 MED ORDER — DILTIAZEM HCL 30 MG PO TABS: ORAL_TABLET | ORAL | 6 refills | Status: DC

## 2022-03-20 MED ORDER — HYDRALAZINE HCL 50 MG PO TABS: 50.0000 mg | ORAL_TABLET | Freq: Two times a day (BID) | ORAL | 6 refills | Status: DC

## 2022-03-20 MED ORDER — FUROSEMIDE 20 MG PO TABS
20.0000 mg | ORAL_TABLET | ORAL | 6 refills | Status: DC | PRN
Start: 2022-03-20 — End: 2023-04-20

## 2022-03-20 MED ORDER — HYDRALAZINE HCL 25 MG PO TABS: 25.0000 mg | ORAL_TABLET | Freq: Two times a day (BID) | ORAL | 6 refills | Status: DC

## 2022-03-20 MED ORDER — POTASSIUM CHLORIDE CRYS ER 20 MEQ PO TBCR: 20.0000 meq | EXTENDED_RELEASE_TABLET | ORAL | 6 refills | Status: DC | PRN

## 2022-03-20 MED ORDER — APIXABAN 5 MG PO TABS: ORAL_TABLET | ORAL | 6 refills | Status: DC
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
## 2022-03-20 NOTE — Patient Instructions (Addendum)
Medication Instructions:  ?Increase your Lasix to 20mg  - may take 1-2 tabs as needed for swelling ?Begin Potassium - take only on the days you take your Lasix ?Increase Hydralazine to 75mg  twice a day   ?Continue all other medications.    ? ?Labwork: ?none ? ?Testing/Procedures: ?none ? ?Follow-Up: ?6 months  ? ?Any Other Special Instructions Will Be Listed Below (If Applicable). ? ? ?If you need a refill on your cardiac medications before your next appointment, please call your pharmacy. ? ?

## 2022-03-25 ENCOUNTER — Telehealth: Payer: Self-pay

## 2022-03-25 NOTE — Telephone Encounter (Signed)
Lvm for pt to call back. 

## 2022-04-28 ENCOUNTER — Ambulatory Visit (INDEPENDENT_AMBULATORY_CARE_PROVIDER_SITE_OTHER): Payer: Medicare Other

## 2022-04-28 DIAGNOSIS — M2042 Other hammer toe(s) (acquired), left foot: Secondary | ICD-10-CM

## 2022-04-28 DIAGNOSIS — E1151 Type 2 diabetes mellitus with diabetic peripheral angiopathy without gangrene: Secondary | ICD-10-CM

## 2022-04-28 DIAGNOSIS — M2041 Other hammer toe(s) (acquired), right foot: Secondary | ICD-10-CM | POA: Diagnosis not present

## 2022-04-28 DIAGNOSIS — L84 Corns and callosities: Secondary | ICD-10-CM | POA: Diagnosis not present

## 2022-04-28 NOTE — Progress Notes (Signed)
SITUATION ?Reason for Visit: Fitting of Diabetic Shoes & Insoles ?Patient / Caregiver Report:  Patient is satisfied with fit and function of shoes and insoles. ? ?OBJECTIVE DATA: ?Patient History / Diagnosis:   ?  ICD-10-CM   ?1. Type II diabetes mellitus with peripheral circulatory disorder (HCC)  E11.51   ?  ?2. Acquired hammertoes of both feet  M20.41   ? M20.42   ?  ?3. Callus  L84   ?  ? ? ?Change in Status:   None ? ?ACTIONS PERFORMED: ?In-Person Delivery, patient was fit with: ?- 1x pair A5500 PDAC approved prefabricated Diabetic Shoes: [id number] ?- 3x pair A5513 PDAC approved vacuum formed custom diabetic insoles; [employer]: [id number] ? ?Shoes and insoles were verified for structural integrity and safety. Patient wore shoes and insoles in office. Skin was inspected and free of areas of concern after wearing shoes and inserts. Shoes and inserts fit properly. Patient / Caregiver provided with ferbal instruction and demonstration regarding donning, doffing, wear, care, proper fit, function, purpose, cleaning, and use of shoes and insoles ' and in all related precautions and risks and benefits regarding shoes and insoles. Patient / Caregiver was instructed to wear properly fitting socks with shoes at all times. Patient was also provided with verbal instruction regarding how to report any failures or malfunctions of shoes or inserts, and necessary follow up care. Patient / Caregiver was also instructed to contact physician regarding change in status that may affect function of shoes and inserts.  ? ?Patient / Caregiver verbalized undersatnding of instruction provided. Patient / Caregiver demonstrated independence with proper donning and doffing of shoes and inserts. ? ?PLAN ?Patient to follow with treating physician as recommended. Plan of care was discussed with and agreed upon by patient and/or caregiver. All questions were answered and concerns addressed. ? ?

## 2022-05-21 ENCOUNTER — Ambulatory Visit (INDEPENDENT_AMBULATORY_CARE_PROVIDER_SITE_OTHER): Payer: Medicare Other | Admitting: Podiatry

## 2022-05-21 ENCOUNTER — Encounter: Payer: Self-pay | Admitting: Podiatry

## 2022-05-21 DIAGNOSIS — B351 Tinea unguium: Secondary | ICD-10-CM

## 2022-05-21 DIAGNOSIS — M79674 Pain in right toe(s): Secondary | ICD-10-CM | POA: Diagnosis not present

## 2022-05-21 DIAGNOSIS — E1151 Type 2 diabetes mellitus with diabetic peripheral angiopathy without gangrene: Secondary | ICD-10-CM | POA: Diagnosis not present

## 2022-05-21 DIAGNOSIS — L84 Corns and callosities: Secondary | ICD-10-CM

## 2022-05-21 DIAGNOSIS — M79675 Pain in left toe(s): Secondary | ICD-10-CM

## 2022-05-26 NOTE — Progress Notes (Signed)
  Subjective:  Patient ID: Veronica Harrison, female    DOB: [birth year],  MRN: [id number]  JANELLEN DOAKES presents to clinic today for at risk foot care. Pt has h/o NIDDM with PAD and callus(es) b/l lower extremities and painful thick toenails that are difficult to trim. Painful toenails interfere with ambulation. Aggravating factors include wearing enclosed shoe gear. Pain is relieved with periodic professional debridement. Painful calluses are aggravated when weightbearing with and without shoegear. Pain is relieved with periodic professional debridement.  Patient states blood glucose was 150 mg/dl today She had new A1c drawn on yesterday..    New problem(s): None.   PCP is Bonnita Hollow, MD , and last visit was February 03, 2022.  Allergies  Allergen Reactions   Other Shortness Of Breath   Penicillins Anaphylaxis   Strawberry Extract Anaphylaxis    Other reaction(s): wheezing   Ace Inhibitors Other (See Comments)    Feeling Faint  Other reaction(s): Pass out   Gabapentin     Urinary incontinence  Other reaction(s): urinary retention   Pseudoephedrine Other (See Comments)    Makes heart race    Review of Systems: Negative except as noted in the HPI.  Objective: LACIE LEHL is a pleasant 71 y.o. female in NAD. AAO X 3.  Vascular Examination: CFT <3 seconds b/l LE. Faintly palpable DP pulses b/l LE. Diminished PT pulse(s) b/l LE. Pedal hair sparse. No pain with calf compression b/l. Nonpitting edema noted b/l lower extremities. Varicosities present b/l. No ischemia or gangrene noted b/l LE. No cyanosis or clubbing noted b/l LE.  Dermatological Examination: Pedal skin thin, shiny and atrophic b/l LE. Well healed scars/surgical scars b/l LE. No open wounds b/l LE. No interdigital macerations noted b/l LE. Toenails 1-5 b/l elongated, discolored, dystrophic, thickened, crumbly with subungual debris and tenderness to dorsal palpation. Hyperkeratotic lesion(s) R hallux,  submet head 1, 5 left foot, and submet head 5 right foot.  No erythema, no edema, no drainage, no fluctuance.  Musculoskeletal Examination: Noted disuse atrophy bilaterally. Hammertoe deformity noted 2-5 b/l. Plantar fat pad atrophy of forefoot area b/l lower extremities. Utilizes cane for ambulation assistance. Diabetic shoes do not accommodate her ankle edema and will not be dispensed.  Neurological Examination: Protective sensation intact 5/5 intact bilaterally with 10g monofilament b/l. Vibratory sensation intact b/l. Assessment/Plan: 1. Pain due to onychomycosis of toenails of both feet   2. Callus   3. Type II diabetes mellitus with peripheral circulatory disorder [facility] [place])     -Patient was evaluated and treated. All patient's and/or POA's questions/concerns answered on today's visit. -Continue diabetic foot care principles: inspect feet daily, monitor glucose as recommended by PCP and/or Endocrinologist, and follow prescribed diet per PCP, Endocrinologist and/or dietician. -Patient to continue soft, supportive shoe gear daily. -Mycotic toenails 1-5 bilaterally were debrided in length and girth with sterile nail nippers and dremel without incident. -Callus(es) R hallux, submet head 1 right foot, and submet head 5 b/l pared utilizing sterile scalpel blade without complication or incident. Total number debrided =4. -Patient/POA to call should there be question/concern in the interim.   Return in about 9 weeks (around 07/23/2022).  Marzetta Board, DPM

## 2022-06-04 ENCOUNTER — Other Ambulatory Visit: Payer: Self-pay | Admitting: Cardiology

## 2022-06-04 ENCOUNTER — Encounter: Payer: Self-pay | Admitting: Cardiology

## 2022-07-30 ENCOUNTER — Other Ambulatory Visit: Payer: Medicare Other

## 2022-07-30 ENCOUNTER — Ambulatory Visit (INDEPENDENT_AMBULATORY_CARE_PROVIDER_SITE_OTHER): Payer: Medicare Other | Admitting: Podiatry

## 2022-07-30 ENCOUNTER — Encounter: Payer: Self-pay | Admitting: Podiatry

## 2022-07-30 DIAGNOSIS — M79675 Pain in left toe(s): Secondary | ICD-10-CM | POA: Diagnosis not present

## 2022-07-30 DIAGNOSIS — E1151 Type 2 diabetes mellitus with diabetic peripheral angiopathy without gangrene: Secondary | ICD-10-CM

## 2022-07-30 DIAGNOSIS — L84 Corns and callosities: Secondary | ICD-10-CM | POA: Diagnosis not present

## 2022-07-30 DIAGNOSIS — E559 Vitamin D deficiency, unspecified: Secondary | ICD-10-CM | POA: Insufficient documentation

## 2022-07-30 DIAGNOSIS — M79674 Pain in right toe(s): Secondary | ICD-10-CM | POA: Diagnosis not present

## 2022-07-30 DIAGNOSIS — B351 Tinea unguium: Secondary | ICD-10-CM

## 2022-07-30 DIAGNOSIS — Z6834 Body mass index (BMI) 34.0-34.9, adult: Secondary | ICD-10-CM | POA: Insufficient documentation

## 2022-07-30 DIAGNOSIS — M1991 Primary osteoarthritis, unspecified site: Secondary | ICD-10-CM | POA: Insufficient documentation

## 2022-07-30 DIAGNOSIS — L409 Psoriasis, unspecified: Secondary | ICD-10-CM | POA: Insufficient documentation

## 2022-07-30 NOTE — Progress Notes (Signed)
  Subjective:  Patient ID: Veronica Harrison, female    DOB: February 27, 1951,  MRN: [id number]  Veronica Harrison presents to clinic today for at risk foot care. Pt has h/o NIDDM with PAD and callus(es) b/l lower extremities and painful thick toenails that are difficult to trim. Painful toenails interfere with ambulation. Aggravating factors include wearing enclosed shoe gear. Pain is relieved with periodic professional debridement. Painful calluses are aggravated when weightbearing with and without shoegear. Pain is relieved with periodic professional debridement.  Patient states blood glucose was 136 mg/dl today.  Last A1c was 7.8%.  New problem(s): None.   PCP is Richardean Chimera, MD , and last visit was "last week".  Allergies  Allergen Reactions   Other Shortness Of Breath   Penicillins Anaphylaxis   Strawberry Extract Anaphylaxis    Other reaction(s): wheezing   Ace Inhibitors Other (See Comments)    Feeling Faint  Other reaction(s): Pass out Other reaction(s): Unknown   Gabapentin     Urinary incontinence  Other reaction(s): urinary retention   Pseudoephedrine Other (See Comments)    Makes heart race    Review of Systems: Negative except as noted in the HPI.  Objective: No changes noted in today's physical examination. Veronica Harrison is a pleasant 71 y.o. female in NAD. AAO X 3.  Vascular Examination: CFT <3 seconds b/l LE. Faintly palpable DP pulses b/l LE. Diminished PT pulse(s) b/l LE. Pedal hair sparse. No pain with calf compression b/l. Nonpitting edema noted b/l lower extremities. Varicosities present b/l. No ischemia or gangrene noted b/l LE. No cyanosis or clubbing noted b/l LE.  Dermatological Examination: Pedal skin thin, shiny and atrophic b/l LE. Well healed scars/surgical scars b/l LE. No open wounds b/l LE. No interdigital macerations noted b/l LE. Toenails 1-5 b/l elongated, discolored, dystrophic, thickened, crumbly with subungual debris and tenderness to  dorsal palpation. Hyperkeratotic lesion(s) medial IPJ hallux bilaterally, submet heads 1, 3, 5 left foot.  No erythema, no edema, no drainage, no fluctuance.  Musculoskeletal Examination: Noted disuse atrophy bilaterally. Hammertoe deformity noted 2-5 b/l. Plantar fat pad atrophy of forefoot area b/l lower extremities. She is ambulating independently without any assistance. Wearing diabetic shoes.  Neurological Examination: Protective sensation intact 5/5 intact bilaterally with 10g monofilament b/l. Vibratory sensation intact b/l.  Assessment/Plan: 1. Pain due to onychomycosis of toenails of both feet   2. Callus   3. Type II diabetes mellitus with peripheral circulatory disorder (HCC)   -Examined patient. -Continue diabetic shoes daily. -Toenails 1-5 b/l were debrided in length and girth with sterile nail nippers and dremel without iatrogenic bleeding.  -Callus(es) medial IPJ of left great toe, medial IPJ of right great toe, submet head 1 left foot, submet head 3 left foot, and submet head 5 left foot pared utilizing sterile scalpel blade without complication or incident. Total number debrided =5. -Patient/POA to call should there be question/concern in the interim.   Return in about 3 months (around 10/30/2022).  Freddie Breech, DPM

## 2022-08-06 ENCOUNTER — Encounter: Payer: Self-pay | Admitting: *Deleted

## 2022-08-06 ENCOUNTER — Other Ambulatory Visit: Payer: Self-pay | Admitting: *Deleted

## 2022-08-06 NOTE — Patient Instructions (Signed)
Visit Information  Thank you for taking time to visit with me today. Please don't hesitate to contact me if I can be of assistance to you before our next scheduled telephone appointment.  Our next appointment is by telephone on October 9   Patient verbalizes understanding of instructions and care plan provided today and agrees to view in MyChart. Active MyChart status and patient understanding of how to access instructions and care plan via MyChart confirmed with patient.     The patient has been provided with contact information for the care management team and has been advised to call with any health related questions or concerns.   Kemper Durie, RN, MSN, [facility] Care Coordination [phone]
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
## 2022-08-21 ENCOUNTER — Other Ambulatory Visit: Payer: Self-pay | Admitting: *Deleted

## 2022-08-21 MED ORDER — APIXABAN 5 MG PO TABS: 5.0000 mg | ORAL_TABLET | Freq: Two times a day (BID) | ORAL | 3 refills | Status: DC

## 2022-09-03 ENCOUNTER — Telehealth: Payer: Self-pay | Admitting: Cardiology

## 2022-09-03 NOTE — Telephone Encounter (Signed)
Applications faxed 08/29/2022 Re-faxed 09/03/2022

## 2022-09-03 NOTE — Telephone Encounter (Signed)
Pt c/o medication issue:  1. Name of Medication: apixaban (ELIQUIS) 5 MG TABS tablet  2. How are you currently taking this medication (dosage and times per day)? As prescribed   3. Are you having a reaction (difficulty breathing--STAT)?   4. What is your medication issue? Pt states she will be out of medication next week and Alver Fisher has not received refill for this for herself or her husband. Requesting call back to discuss.

## 2022-09-04 ENCOUNTER — Encounter (INDEPENDENT_AMBULATORY_CARE_PROVIDER_SITE_OTHER): Payer: Self-pay | Admitting: *Deleted

## 2022-09-04 ENCOUNTER — Encounter: Payer: Self-pay | Admitting: Cardiology

## 2022-09-04 NOTE — Telephone Encounter (Signed)
Advised that applications for her and husband re-faxed yesterday. Verbalized understanding.

## 2022-09-04 NOTE — Telephone Encounter (Signed)
Error

## 2022-09-09 ENCOUNTER — Telehealth: Payer: Self-pay | Admitting: *Deleted

## 2022-09-09 NOTE — Telephone Encounter (Signed)
Received fax from [employer] - Eliquis approved from 09/08/2022 through 12/28/2022.

## 2022-09-23 ENCOUNTER — Encounter: Payer: Self-pay | Admitting: Cardiology

## 2022-09-23 ENCOUNTER — Ambulatory Visit: Payer: Medicare Other | Attending: Cardiology | Admitting: Cardiology

## 2022-09-23 VITALS — BP 140/75 | HR 62 | Ht 64.0 in | Wt 215.2 lb

## 2022-09-23 DIAGNOSIS — I4891 Unspecified atrial fibrillation: Secondary | ICD-10-CM

## 2022-09-23 DIAGNOSIS — R600 Localized edema: Secondary | ICD-10-CM

## 2022-09-23 DIAGNOSIS — I10 Essential (primary) hypertension: Secondary | ICD-10-CM

## 2022-09-23 DIAGNOSIS — D6869 Other thrombophilia: Secondary | ICD-10-CM

## 2022-09-23 MED ORDER — HYDRALAZINE HCL 25 MG PO TABS: 25.0000 mg | ORAL_TABLET | Freq: Three times a day (TID) | ORAL | 3 refills | Status: DC

## 2022-09-23 MED ORDER — HYDRALAZINE HCL 50 MG PO TABS: 50.0000 mg | ORAL_TABLET | Freq: Three times a day (TID) | ORAL | 3 refills | Status: DC

## 2022-09-23 NOTE — Patient Instructions (Addendum)
Medication Instructions:  Increase Hydralazine to 75mg three times per day Continue all other medications.     Labwork: none  Testing/Procedures: none  Follow-Up: 6 months   Any Other Special Instructions Will Be Listed Below (If Applicable).    If you need a refill on your cardiac medications before your next appointment, please call your pharmacy.  
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
## 2022-10-06 ENCOUNTER — Encounter: Payer: Medicare Other | Admitting: *Deleted

## 2022-10-06 ENCOUNTER — Ambulatory Visit: Payer: Self-pay | Admitting: *Deleted

## 2022-10-06 NOTE — Patient Instructions (Signed)
Visit Information  Thank you for taking time to visit with me today. Please don't hesitate to contact me if I can be of assistance to you before our next scheduled telephone appointment.  Following are the goals we discussed today:  Be sure to follow up with cardiology office in January, 3 month follow up.  Our next appointment is by telephone on 12/5  Please call the care guide team at [phone] if you need to cancel or reschedule your appointment.   Please call the Suicide and Crisis Lifeline: 988 call the [place] National Suicide Prevention Lifeline: 959-485-0701 or TTY: 587-246-1608 TTY 551-311-9507) to talk to a trained counselor call 1-800-273-TALK (toll free, 24 hour hotline) call the [facility]: [phone] call 911 if you are experiencing a Mental Health or [place] or need someone to talk to.  Patient verbalizes understanding of instructions and care plan provided today and agrees to view in [place]. Active MyChart status and patient understanding of how to access instructions and care plan via MyChart confirmed with patient.     The patient has been provided with contact information for the care management team and has been advised to call with any health related questions or concerns.   Valente David, RN, MSN, [place] Care Management Care Management Coordinator [phone]

## 2022-10-06 NOTE — Patient Outreach (Signed)
  Care Coordination   Follow Up Visit Note   10/06/2022 Name: Veronica Harrison MRN: [id number] DOB: 07/29/1951  Veronica Harrison is a 71 y.o. year old female who sees Caryl Bis, MD for primary care. I spoke with  Gregary Signs by phone today.  What matters to the patients health and wellness today?  BP control, remains slightly elevated.  Seen by cardiology, antihypertensive dose increased.  Denies any urgent concerns, encouraged to contact this care manager with questions.      Goals Addressed               This Visit's Progress     HTN management at goal (pt-stated)   On track     Care Coordination Interventions: Evaluation of current treatment plan related to hypertension self management and patient's adherence to plan as established by provider Reviewed medications with patient and discussed importance of compliance Discussed plans with patient for ongoing care management follow up and provided patient with direct contact information for care management team Reviewed scheduled/upcoming provider appointments including:  Provided education on prescribed diet heart healthy Discussed complications of poorly controlled blood pressure such as heart disease, stroke, circulatory complications, vision complications, kidney impairment, sexual dysfunction Encouraged to call cardiology office if no call within the month to schedule follow up appointment in January Reviewed medication changes with member Reviewed home BP monitoring trends, 140s/70s         SDOH assessments and interventions completed:  No     Care Coordination Interventions Activated:  Yes  Care Coordination Interventions:  Yes, provided   Follow up plan: Follow up call scheduled for 12/5    Encounter Outcome:  Pt. Visit Completed   Valente David, RN, MSN, [place] Care Management Care Management Coordinator [phone]

## 2022-10-06 NOTE — Patient Outreach (Signed)
  Care Coordination   10/06/2022 Name: Veronica Harrison MRN: [id number] DOB: 1951/09/24   Care Coordination Outreach Attempts:  An unsuccessful telephone outreach was attempted for a scheduled appointment today.  Follow Up Plan:  Additional outreach attempts will be made to offer the patient care coordination information and services.   Encounter Outcome:  No Answer  Care Coordination Interventions Activated:  No   Care Coordination Interventions:  No, not indicated    Valente David, RN, MSN, [facility] Care Management Care Management Coordinator [phone]

## 2022-10-18 ENCOUNTER — Other Ambulatory Visit: Payer: Self-pay | Admitting: Cardiology

## 2022-11-10 ENCOUNTER — Ambulatory Visit: Payer: Medicare Other | Admitting: Dermatology

## 2022-11-12 ENCOUNTER — Encounter: Payer: Self-pay | Admitting: Podiatry

## 2022-11-12 ENCOUNTER — Ambulatory Visit (INDEPENDENT_AMBULATORY_CARE_PROVIDER_SITE_OTHER): Payer: Medicare Other | Admitting: Podiatry

## 2022-11-12 DIAGNOSIS — E1151 Type 2 diabetes mellitus with diabetic peripheral angiopathy without gangrene: Secondary | ICD-10-CM | POA: Diagnosis not present

## 2022-11-12 DIAGNOSIS — B351 Tinea unguium: Secondary | ICD-10-CM | POA: Diagnosis not present

## 2022-11-12 DIAGNOSIS — M79674 Pain in right toe(s): Secondary | ICD-10-CM | POA: Diagnosis not present

## 2022-11-12 DIAGNOSIS — L84 Corns and callosities: Secondary | ICD-10-CM | POA: Diagnosis not present

## 2022-11-12 DIAGNOSIS — M79675 Pain in left toe(s): Secondary | ICD-10-CM | POA: Diagnosis not present

## 2022-11-17 NOTE — Progress Notes (Signed)
  Subjective:  Patient ID: Veronica Harrison, female    DOB: January 16, 1951,  MRN: [id number]  Veronica Harrison presents to clinic today for at risk foot care. Pt has h/o NIDDM with PAD and callus(es) b/l lower extremities and painful thick toenails that are difficult to trim. Painful toenails interfere with ambulation. Aggravating factors include wearing enclosed shoe gear. Pain is relieved with periodic professional debridement. Painful calluses are aggravated when weightbearing with and without shoegear. Pain is relieved with periodic professional debridement.  Chief Complaint  Patient presents with   Nail Problem    Diabetic foot care BS-did not check A1C-7.8 PCP-Terry Daniel PCP VST-4 months ag0   New problem(s): None.   PCP is Richardean Chimera, MD.  Allergies  Allergen Reactions   Other Shortness Of Breath   Penicillins Anaphylaxis   Strawberry Extract Anaphylaxis    Other reaction(s): wheezing   Ace Inhibitors Other (See Comments)    Feeling Faint  Other reaction(s): Pass out Other reaction(s): Unknown   Gabapentin     Urinary incontinence  Other reaction(s): urinary retention   Pseudoephedrine Other (See Comments)    Makes heart race    Review of Systems: Negative except as noted in the HPI.  Objective:   Veronica Harrison is a pleasant 71 y.o. female in NAD. AAO x 3.  Vascular Examination: CFT <3 seconds b/l LE. Faintly palpable DP pulses b/l LE. Diminished PT pulse(s) b/l LE. Pedal hair sparse. No pain with calf compression b/l. Nonpitting edema noted b/l lower extremities. Varicosities present b/l. No ischemia or gangrene noted b/l LE. No cyanosis or clubbing noted b/l LE.  Dermatological Examination: Pedal skin thin, shiny and atrophic b/l LE. Well healed scars/surgical scars b/l LE. No open wounds b/l LE. No interdigital macerations noted b/l LE.   Toenails 1-5 b/l elongated, discolored, dystrophic, thickened, crumbly with subungual debris and tenderness to  dorsal palpation.   Hyperkeratotic lesion(s) medial IPJ hallux right foot, submet heads 4, 5 left foot.  No erythema, no edema, no drainage, no fluctuance.  Musculoskeletal Examination: Noted disuse atrophy bilaterally. Hammertoe deformity noted 2-5 b/l. Plantar fat pad atrophy of forefoot area b/l lower extremities. She is ambulating independently without any assistance. Wearing diabetic shoes.  Neurological Examination: Protective sensation intact 5/5 intact bilaterally with 10g monofilament b/l. Vibratory sensation intact b/l.  Assessment/Plan: 1. Pain due to onychomycosis of toenails of both feet   2. Callus   3. Type II diabetes mellitus with peripheral circulatory disorder (HCC)     No orders of the defined types were placed in this encounter.   -Patient was evaluated and treated. All patient's and/or POA's questions/concerns answered on today's visit. -Continue diabetic foot care principles: inspect feet daily, monitor glucose as recommended by PCP and/or Endocrinologist, and follow prescribed diet per PCP, Endocrinologist and/or dietician. -Mycotic toenails 1-5 bilaterally were debrided in length and girth with sterile nail nippers and dremel without incident. -Callus(es) right great toe, submet head 4 left foot, and submet head 5 left foot pared utilizing sterile scalpel blade without complication or incident. Total number debrided =3. -Patient/POA to call should there be question/concern in the interim.   Return in about 3 months (around 02/12/2023).  Freddie Breech, DPM

## 2022-11-27 ENCOUNTER — Telehealth: Payer: Self-pay | Admitting: *Deleted

## 2022-11-27 NOTE — Progress Notes (Signed)
  Care Coordination Note  11/27/2022 Name: Veronica Harrison MRN: [id number] DOB: 1951/07/26  Veronica Harrison is a 71 y.o. year old female who is a primary care patient of Richardean Chimera, MD and is actively engaged with the care management team. I reached out to [name] by phone today to assist with re-scheduling a follow up visit with the RN Case Manager  Follow up plan: Unsuccessful telephone outreach attempt made. A HIPAA compliant phone message was left for the patient providing contact information and requesting a return call.   [facility]  Care Coordination Care Guide  Direct Dial: [phone]

## 2022-11-27 NOTE — Progress Notes (Signed)
  Care Coordination Note  11/27/2022 Name: Veronica Harrison MRN: [id number] DOB: 1951/07/13  Veronica Harrison is a 71 y.o. year old female who is a primary care patient of Richardean Chimera, MD and is actively engaged with the care management team. I reached out to [name] by phone today to assist with re-scheduling a follow up visit with the RN Case Manager  Follow up plan: Telephone appointment with care management team member scheduled for:12/02/22  [facility] Coordination Care Guide  Direct Dial: [phone]

## 2022-12-02 ENCOUNTER — Encounter: Payer: Medicare Other | Admitting: *Deleted

## 2022-12-02 ENCOUNTER — Ambulatory Visit: Payer: Self-pay | Admitting: *Deleted

## 2022-12-02 NOTE — Patient Outreach (Signed)
  Care Coordination   12/02/2022 Name: Veronica Harrison MRN: [id number] DOB: [birth year]   Care Coordination Outreach Attempts:  An unsuccessful telephone outreach was attempted for a scheduled appointment today.  Follow Up Plan:  No further outreach attempts will be made at this time. We have been unable to contact the patient to offer or enroll patient in care coordination services  Encounter Outcome:  No Answer   Care Coordination Interventions:  No, not indicated    Demetrios Loll, BSN, RN-BC RN Care Coordinator [facility]  [facility] Direct Dial: [phone] Main #: [phone]

## 2022-12-12 ENCOUNTER — Other Ambulatory Visit: Payer: Self-pay | Admitting: Cardiology

## 2023-02-16 ENCOUNTER — Other Ambulatory Visit: Payer: Self-pay | Admitting: *Deleted

## 2023-02-16 MED ORDER — APIXABAN 5 MG PO TABS: 5.0000 mg | ORAL_TABLET | Freq: Two times a day (BID) | ORAL | 3 refills | Status: DC

## 2023-03-02 ENCOUNTER — Telehealth: Payer: Self-pay | Admitting: Cardiology

## 2023-03-02 NOTE — Telephone Encounter (Signed)
Request for Eliquis '5mg'$  tablet samples: Indication: AF Last office visit: 09/23/22  Zandra Abts MD Scr: 0.73 on 05/28/22  Epic Age:  72 Weight: 97.6kg  Based on above findings Eliquis '5mg'$  twice daily is the appropriate dose.  OK to give samples if available.

## 2023-03-02 NOTE — Telephone Encounter (Signed)
  Patient calling the office for samples of medication:   1.  What medication and dosage are you requesting samples for?  apixaban (ELIQUIS) 5 MG TABS tablet    2.  Are you currently out of this medication? Yes  Pt said, they are still waiting for pt assistance approval

## 2023-03-03 NOTE — Telephone Encounter (Signed)
Left message to return call.  

## 2023-03-04 MED ORDER — APIXABAN 5 MG PO TABS: 5.0000 mg | ORAL_TABLET | Freq: Two times a day (BID) | ORAL | 0 refills | Status: DC

## 2023-03-04 NOTE — Telephone Encounter (Signed)
Patient notified samples ready for pick up.  Also informed that she did not qualify for assistance as they make over the required dollar amount.  Advised that she could call Eliquis [employer] to see if they may have any other suggestions for her.

## 2023-03-09 ENCOUNTER — Ambulatory Visit: Payer: Medicare Other | Admitting: Podiatry

## 2023-03-16 ENCOUNTER — Telehealth: Payer: Self-pay | Admitting: *Deleted

## 2023-03-16 ENCOUNTER — Ambulatory Visit (INDEPENDENT_AMBULATORY_CARE_PROVIDER_SITE_OTHER): Payer: Medicare Other | Admitting: Podiatry

## 2023-03-16 DIAGNOSIS — M79674 Pain in right toe(s): Secondary | ICD-10-CM

## 2023-03-16 DIAGNOSIS — E119 Type 2 diabetes mellitus without complications: Secondary | ICD-10-CM | POA: Diagnosis not present

## 2023-03-16 DIAGNOSIS — B351 Tinea unguium: Secondary | ICD-10-CM

## 2023-03-16 DIAGNOSIS — M79675 Pain in left toe(s): Secondary | ICD-10-CM

## 2023-03-16 DIAGNOSIS — E1151 Type 2 diabetes mellitus with diabetic peripheral angiopathy without gangrene: Secondary | ICD-10-CM | POA: Diagnosis not present

## 2023-03-16 DIAGNOSIS — M2042 Other hammer toe(s) (acquired), left foot: Secondary | ICD-10-CM

## 2023-03-16 DIAGNOSIS — L84 Corns and callosities: Secondary | ICD-10-CM | POA: Diagnosis not present

## 2023-03-16 DIAGNOSIS — M2041 Other hammer toe(s) (acquired), right foot: Secondary | ICD-10-CM | POA: Diagnosis not present

## 2023-03-16 NOTE — Telephone Encounter (Signed)
Received fax from [facility] - patient approved from 03/04/2023 through 12/29/2023.

## 2023-03-16 NOTE — Progress Notes (Signed)
ANNUAL DIABETIC FOOT EXAM Subjective: Veronica Harrison presents today {jgcomplaint:23593}.  Chief Complaint  Patient presents with   Nail Problem    [facility] BS-did not check today A1C-8.[facility] PCP VST-3 Weeks ago   Patient confirms h/o diabetes.  Patient relates {Numbers; 0-100:15068} year h/o diabetes.  Patient denies any h/o foot wounds.  Patient has h/o foot ulcer of {jgPodToeLocator:23637}, which healed via help of ***.  Patient has h/o amputation(s): {jgamp:23617}.  Patient endorses symptoms of foot numbness.   Patient endorses symptoms of foot tingling.  Patient endorses symptoms of burning in feet.  Patient endorses symptoms of pins/needles sensation in feet.  Patient denies any numbness, tingling, burning, or pins/needle sensation in feet.  Patient has been diagnosed with neuropathy and it is managed with {JGNEUROPATHYMEDS:27053}.  Risk factors: {jgriskfactors:24044}.  Caryl Bis, MD is patient's PCP.  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   Hypotropia of left eye 06/12/2021   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   Urethral bleeding 02/27/2021   At risk for falls 07/20/2020   Depression 07/20/2020   Former smoker 07/20/2020   GERD (gastroesophageal reflux disease) 07/20/2020   High-tone pelvic floor dysfunction 07/18/2020   Age-related osteoporosis with current  pathological fracture 05/15/2020   Kyphosis due to osteoporosis 05/15/2020   Closed compression fracture of body of L1 vertebra ([place]) 05/15/2020   Osteopenia 03/27/2020   Degeneration of lumbar intervertebral disc 01/25/2020   History of revision of total replacement of left hip joint 01/19/2020   Pain of lumbar spine 01/11/2020   Enterocele 08/26/2019   Voiding dysfunction 08/26/2019   Chronic, continuous use of opioids 08/06/2018   Graves' disease 08/06/2018   Primary osteoarthritis of left knee 07/23/2018   Chronic anemia 05/17/2018   Elevated platelet count 05/17/2018   Psoriatic arthritis ([place]) 05/17/2018   Status post total hip replacement, 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   Chronic anticoagulation 01/31/2018   Left knee pain 09/30/2017   Right hip pain 09/30/2017   Injury of iliac artery 08/24/2017   Intramural aortic hematoma ([place]) 07/22/2017   SAH (subarachnoid hemorrhage) ([place]) 07/22/2017   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 07/21/2017   Fracture of head of left femur ([place]) 07/21/2017   Posterior dislocation of hip, closed, left, initial encounter ([place]) 07/21/2017   Laceration of knee 06/10/2017   Vaginal prolapse 04/14/2017   Urinary retention 07/21/2016   Hypothyroidism due to medicaments and other exogenous substances 01/28/2016   A-fib ([place]) 03/10/2015   HTN (hypertension) 03/10/2015   DM2 (diabetes mellitus, type 2) ([place]) 03/10/2015   Urinary bladder incontinence 10/11/2014   Prolapse of vaginal vault after hysterectomy 02/20/2012   Past Surgical History:  Procedure Laterality Date   ABDOMINAL HYSTERECTOMY     CHOLECYSTECTOMY     TOTAL HIP ARTHROPLASTY  01/29/2018   TOTAL KNEE ARTHROPLASTY Right    VARICOSE VEIN SURGERY Left  Current Outpatient Medications on File Prior to Visit  Medication Sig Dispense Refill   acetaminophen (TYLENOL) 500 MG tablet Take 500 mg by mouth 2 (two) times daily as needed.     albuterol (PROVENTIL) (2.5 MG/3ML) 0.083%  nebulizer solution Inhale into the lungs.     apixaban (ELIQUIS) 5 MG TABS tablet Take 1 tablet (5 mg total) by mouth 2 (two) times daily. 28 tablet 0   bisacodyl (DULCOLAX) 5 MG EC tablet Take 5 mg by mouth at bedtime.     Blood Pressure KIT 1 Device by Does not apply route daily. 1 each 0   Calcium Carbonate-Vitamin D 600-200 MG-UNIT TABS Take 1 tablet by mouth daily.     Chlorphen-Pseudoephed-APAP (CORICIDIN D PO) Take by mouth.     clindamycin (CLEOCIN) 150 MG capsule 4 capsules Orally prior to dental visits     clobetasol ointment (TEMOVATE) 0.05 % Apply topically.     clonazePAM (KLONOPIN) 1 MG tablet Take 1 mg by mouth at bedtime.     cyanocobalamin (,VITAMIN B-12,) 1000 MCG/ML injection Inject into the muscle.     diclofenac Sodium (VOLTAREN) 1 % GEL SMARTSIG:3 Topical 10 Times Daily PRN     diltiazem (CARDIZEM) 30 MG tablet TAKE 1 TABLET TWICE DAILY -- MAY TAKE ADDITIONAL TABLET AS NEEDED FOR PALPITATIONSOR SBP >150 75 tablet 1   estradiol (ESTRACE) 0.1 MG/GM vaginal cream Insert pea-sized amount of cream per vagina every other night     Fluticasone-Salmeterol (ADVAIR) 250-50 MCG/DOSE AEPB Inhale 1 puff into the lungs as needed.     folic acid (FOLVITE) 1 MG tablet Take 1 mg by mouth daily.     furosemide (LASIX) 20 MG tablet Take 1-2 tablets (20-40 mg total) by mouth as needed for edema or fluid (swelling). 30 tablet 6   glucose blood test strip 1 each by Other route as needed for other. Use as instructed     hydrALAZINE (APRESOLINE) 25 MG tablet Take 1 tablet (25 mg total) by mouth 3 (three) times daily. 270 tablet 3   hydrALAZINE (APRESOLINE) 50 MG tablet Take 1 tablet (50 mg total) by mouth 3 (three) times daily. 270 tablet 3   ipratropium (ATROVENT HFA) 17 MCG/ACT inhaler Inhale 2 puffs into the lungs every 6 (six) hours.     Lancets 28G MISC 28 g by Does not apply route daily.     levothyroxine (SYNTHROID) 150 MCG tablet 1 tablet on an empty stomach in the morning Orally Once a  day     metFORMIN (GLUCOPHAGE) 500 MG tablet 1 tablet with meals Orally Twice a day     methotrexate (RHEUMATREX) 2.5 MG tablet 12.5 mg once a week.     Misc. Devices (PULSE OXIMETER FOR FINGER) MISC 1 Device by Does not apply route daily. 1 each 0   mupirocin ointment (BACTROBAN) 2 % Apply 1 application topically daily as needed.      nystatin cream (MYCOSTATIN) Apply 1 application topically 3 (three) times daily.     ondansetron (ZOFRAN) 4 MG tablet Take by mouth.     oxyCODONE (OXYCONTIN) 30 MG 12 hr tablet Take 30 mg by mouth 5 (five) times daily.     potassium chloride SA (KLOR-CON M) 20 MEQ tablet Take 1 tablet (20 mEq total) by mouth as needed (only on the days you take your Lasix). 30 tablet 6   Risankizumab-rzaa (SKYRIZI PEN) 150 MG/ML SOAJ 150mg  Subcutaneous every 12 weeks     sotalol (BETAPACE) 120 MG tablet TAKE (  1) TABLET TWICE DAILY. 60 tablet 6   tamsulosin (FLOMAX) 0.4 MG CAPS capsule Take by mouth.     triamcinolone (NASACORT) 55 MCG/ACT AERO nasal inhaler Place into the nose.     Vitamin D, Ergocalciferol, (DRISDOL) 1.25 MG (50000 UNIT) CAPS capsule Take 50,000 Units by mouth every 7 (seven) days.     No current facility-administered medications on file prior to visit.    Allergies  Allergen Reactions   Other Shortness Of Breath   Penicillins Anaphylaxis   Strawberry Extract Anaphylaxis    Other reaction(s): wheezing   Ace Inhibitors Other (See Comments)    Feeling Faint  Other reaction(s): Pass out Other reaction(s): Unknown   Gabapentin     Urinary incontinence  Other reaction(s): urinary retention   Pseudoephedrine Other (See Comments)    Makes heart race   Social History   Occupational History   Not on file  Tobacco Use   Smoking status: Former    Packs/day: 1.00    Years: 34.00    Additional pack years: 0.00    Total pack years: 34.00    Types: Cigarettes    Start date: 01/29/1970    Quit date: 09/30/2004    Years since quitting: 18.4   Smokeless  tobacco: Never   Tobacco comments:    10 years ago  Vaping Use   Vaping Use: Never used  Substance and Sexual Activity   Alcohol use: No    Alcohol/week: 0.0 standard drinks of alcohol   Drug use: No   Sexual activity: Not on file   Family History  Problem Relation Age of Onset   Diabetes Other    CAD Other     There is no immunization history on file for this patient.   Review of Systems: Negative except as noted in the HPI.   Objective: There were no vitals filed for this visit.  Veronica Harrison is a pleasant 72 y.o. female in NAD. AAO X 3.  Vascular Examination: {jgvascular:23595}  Dermatological Examination: {jgderm:23598}  Neurological Examination: {jgneuro:23601::"Protective sensation intact 5/5 intact bilaterally with 10g monofilament b/l.","Vibratory sensation intact b/l.","Proprioception intact bilaterally."}  Musculoskeletal Examination: {jgmsk:23600}  Footwear Assessment: Does the patient wear appropriate shoes? {Yes,No}. Does the patient need inserts/orthotics? {Yes,No}.  ADA Risk Categorization: Low Risk :  Patient has all of the following: Intact protective sensation No prior foot ulcer  No severe deformity Pedal pulses present  High Risk  Patient has one or more of the following: Loss of protective sensation Absent pedal pulses Severe Foot deformity History of foot ulcer  Assessment: 1. Pain due to onychomycosis of toenails of both feet   2. Callus   3. Acquired hammertoes of both feet   4. Type II diabetes mellitus with peripheral circulatory disorder (HCC)   5. Encounter for diabetic foot exam ([place])     Plan: Orders Placed This Encounter  Procedures   For Home Use Only DME Diabetic Shoe    Dispense one pair extra depth shoes and 3 pair custom insoles. Offload calluses submet heads 1, 3, 5 left foot.   FOR HOME USE ONLY DME DIABETIC SHOE  {jgplan:23602::"-Patient/POA to call should there be question/concern in the  interim."} Return in about 3 months (around 06/16/2023).  Marzetta Board, DPM

## 2023-03-17 ENCOUNTER — Other Ambulatory Visit: Payer: Self-pay | Admitting: Cardiology

## 2023-03-17 ENCOUNTER — Encounter: Payer: Self-pay | Admitting: Podiatry

## 2023-04-08 ENCOUNTER — Ambulatory Visit: Payer: Medicare Other

## 2023-04-09 ENCOUNTER — Telehealth: Payer: Self-pay | Admitting: Cardiology

## 2023-04-09 NOTE — Telephone Encounter (Signed)
   Pre-operative Risk Assessment    Patient Name: Veronica Harrison  DOB: 08-Apr-1951 MRN: [id number]      Request for Surgical Clearance    Procedure:  posterior repair enterocele repair for vaginal prolapse   Date of Surgery:  Clearance TBD                                 Surgeon:  Dr. Verline Lema  Surgeon's Group or Practice Name:  [facility] Phone number:  [phone] Fax number:  [phone]   Type of Clearance Requested:   - Pharmacy:  Hold Apixaban (Eliquis) looking for cardiology guidance    Type of Anesthesia:  General    Additional requests/questions:    SignedDamaris Schooner   04/09/2023, 12:19 PM

## 2023-04-10 NOTE — Telephone Encounter (Signed)
   Name: Veronica Harrison  DOB: 11-Feb-1951  MRN: [id number]  Primary Cardiologist: Dina Rich, MD   Preoperative team, please contact this patient and set up a phone call appointment for further preoperative risk assessment. Please obtain consent and complete medication review. Thank you for your help.  I confirm that guidance regarding antiplatelet and oral anticoagulation therapy has been completed and, if necessary, noted below. Per Pharm D: Patient with diagnosis of afib on Eliquis for anticoagulation.     Procedure: posterior repair enterocele repair for vaginal prolapse  Date of procedure: TBD    Per office protocol, patient can hold Eliquis for 2-3 days prior to procedure.       Carlos Levering, NP 04/10/2023, 9:07 AM [facility]

## 2023-04-10 NOTE — Telephone Encounter (Signed)
I left a message for the patient to call our office back to schedule a tele visit for pre-op clearance.  

## 2023-04-10 NOTE — Telephone Encounter (Signed)
Patient with diagnosis of afib on Eliquis for anticoagulation.    Procedure: posterior repair enterocele repair for vaginal prolapse  Date of procedure: TBD  CHA2DS2-VASc Score = 4  This indicates a 4.8% annual risk of stroke. The patient's score is based upon: CHF History: 0 HTN History: 1 Diabetes History: 1 Stroke History: 0 Vascular Disease History: 0 Age Score: 1 Gender Score: 1   CrCl 49mL/min using adj body weight Platelet count 488K  Per office protocol, patient can hold Eliquis for 2-3 days prior to procedure.    **This guidance is not considered finalized until pre-operative APP has relayed final recommendations.**

## 2023-04-13 NOTE — Telephone Encounter (Signed)
Called pt regarding surgical clearance and the need for a tele visit, for surgical clearance.  Per pt, she is going to push out the surgery for a few months, due to some medical conditions she is having at the moment.  Pt aware that we will route back to the requesting surgeon's office to make them aware, and they can resend the surgical clearance request once surgery has been scheduled.  Will route to the requesting surgeon's office to make them aware.

## 2023-04-14 ENCOUNTER — Other Ambulatory Visit: Payer: Self-pay | Admitting: Family Medicine

## 2023-04-18 ENCOUNTER — Other Ambulatory Visit: Payer: Self-pay | Admitting: Cardiology

## 2023-04-20 ENCOUNTER — Other Ambulatory Visit: Payer: Self-pay | Admitting: Cardiology

## 2023-04-20 ENCOUNTER — Ambulatory Visit: Payer: Medicare Other

## 2023-04-22 ENCOUNTER — Encounter (INDEPENDENT_AMBULATORY_CARE_PROVIDER_SITE_OTHER): Payer: Self-pay | Admitting: *Deleted

## 2023-05-11 ENCOUNTER — Encounter: Payer: Self-pay | Admitting: *Deleted

## 2023-05-11 ENCOUNTER — Encounter: Payer: Self-pay | Admitting: Cardiology

## 2023-05-11 ENCOUNTER — Telehealth: Payer: Self-pay | Admitting: Cardiology

## 2023-05-11 ENCOUNTER — Ambulatory Visit: Payer: Medicare Other | Attending: Cardiology | Admitting: Cardiology

## 2023-05-11 VITALS — BP 130/68 | HR 72 | Ht 64.0 in | Wt 223.2 lb

## 2023-05-11 DIAGNOSIS — I4891 Unspecified atrial fibrillation: Secondary | ICD-10-CM | POA: Insufficient documentation

## 2023-05-11 DIAGNOSIS — R0602 Shortness of breath: Secondary | ICD-10-CM | POA: Insufficient documentation

## 2023-05-11 MED ORDER — HYDRALAZINE HCL 100 MG PO TABS: 100.0000 mg | ORAL_TABLET | Freq: Two times a day (BID) | ORAL | 6 refills | Status: DC

## 2023-05-11 NOTE — Patient Instructions (Addendum)
Medication Instructions:   Change Hydralazine to 100mg  twice a day   Continue all other medications.     Labwork:  none  Testing/Procedures:  Your physician has requested that you have a lexiscan myoview. For further information please visit [email or web address] Please follow instruction sheet, as given.  Office will contact with results via phone, letter or mychart.     Follow-Up:  6 months   Any Other Special Instructions Will Be Listed Below (If Applicable).   If you need a refill on your cardiac medications before your next appointment, please call your pharmacy.
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
## 2023-05-11 NOTE — Telephone Encounter (Signed)
Patient wants to know if she can take a pain pill on the morning of her stress test?

## 2023-05-12 ENCOUNTER — Telehealth: Payer: Self-pay | Admitting: Cardiology

## 2023-05-12 NOTE — Telephone Encounter (Signed)
Checking percert on the following patient for testing scheduled at [facility].   LEXISCAN    05/25/2023

## 2023-05-12 NOTE — Telephone Encounter (Signed)
Informed pt that it is ok to take pain meds prior to stress test. Pt states that she plans on taking the night before.

## 2023-05-14 ENCOUNTER — Ambulatory Visit (INDEPENDENT_AMBULATORY_CARE_PROVIDER_SITE_OTHER): Payer: Medicare Other

## 2023-05-14 DIAGNOSIS — M2042 Other hammer toe(s) (acquired), left foot: Secondary | ICD-10-CM

## 2023-05-14 DIAGNOSIS — E1151 Type 2 diabetes mellitus with diabetic peripheral angiopathy without gangrene: Secondary | ICD-10-CM

## 2023-05-14 DIAGNOSIS — M2041 Other hammer toe(s) (acquired), right foot: Secondary | ICD-10-CM

## 2023-05-14 NOTE — Progress Notes (Signed)
Patient presents to the office today for diabetic shoe and insole measuring.  ABN signed.   Documentation of medical necessity will be sent to patient's treating diabetic doctor to verify and sign.   Patient's diabetic provider: Talmage Coin    NPI: [id number]   Shoes and insoles will be ordered at that time and patient will be notified for an appointment for fitting when they arrive.   Patient shoe selection-   1st   Shoe choice:   [id number]  Shoe size ordered: 10 W

## 2023-05-17 ENCOUNTER — Other Ambulatory Visit: Payer: Self-pay | Admitting: Cardiology

## 2023-05-25 ENCOUNTER — Encounter (HOSPITAL_COMMUNITY): Payer: Medicare Other

## 2023-05-26 ENCOUNTER — Encounter (HOSPITAL_COMMUNITY)
Admission: RE | Admit: 2023-05-26 | Discharge: 2023-05-26 | Disposition: A | Discharge status: Home / Self Care | Payer: Medicare Other | Source: Ambulatory Visit | Attending: Cardiology | Admitting: Cardiology

## 2023-05-26 ENCOUNTER — Ambulatory Visit (HOSPITAL_COMMUNITY)
Admission: RE | Admit: 2023-05-26 | Discharge: 2023-05-26 | Disposition: A | Discharge status: Home / Self Care | Payer: Medicare Other | Source: Ambulatory Visit | Attending: Cardiology | Admitting: Cardiology

## 2023-05-26 DIAGNOSIS — I4891 Unspecified atrial fibrillation: Secondary | ICD-10-CM | POA: Diagnosis present

## 2023-05-26 LAB — NM MYOCAR MULTI W/SPECT W/WALL MOTION / EF
Base ST Depression (mm): 0 mm
LV dias vol: 91 mL (ref 46–106)
LV sys vol: 26 mL
Nuc Stress EF: 71 %
Peak HR: 87 {beats}/min
RATE: 0.4
Rest HR: 64 {beats}/min
Rest Nuclear Isotope Dose: 10.4 mCi
SDS: 3
SRS: 0
SSS: 3
ST Depression (mm): 0 mm
Stress Nuclear Isotope Dose: 30 mCi
TID: 1.07

## 2023-05-26 MED ORDER — SODIUM CHLORIDE FLUSH 0.9 % IV SOLN
INTRAVENOUS | Status: AC
  Administered 2023-05-26: 10 mL via INTRAVENOUS
  Filled 2023-05-26: qty 10

## 2023-05-26 MED ORDER — REGADENOSON 0.4 MG/5ML IV SOLN
INTRAVENOUS | Status: AC
  Administered 2023-05-26: 0.4 mg via INTRAVENOUS
  Filled 2023-05-26: qty 5

## 2023-05-26 MED ORDER — TECHNETIUM TC 99M TETROFOSMIN IV KIT
30.0000 | PACK | Freq: Once | INTRAVENOUS | Status: AC | PRN
  Administered 2023-05-26: 30 via INTRAVENOUS

## 2023-05-26 MED ORDER — TECHNETIUM TC 99M TETROFOSMIN IV KIT
10.0000 | PACK | Freq: Once | INTRAVENOUS | Status: AC | PRN
  Administered 2023-05-26: 10.4 via INTRAVENOUS

## 2023-06-02 ENCOUNTER — Telehealth: Payer: Self-pay | Admitting: *Deleted

## 2023-06-02 NOTE — Telephone Encounter (Signed)
Lesle Chris, LPN 4/0/9811  9:14 PM EDT Back to Top    Notified, copy to pcp & Dr. Eduardo Osier (urogynecology).

## 2023-06-02 NOTE — Telephone Encounter (Signed)
-----   Message from Antoine Poche, MD sent at 06/01/2023  9:10 AM EDT ----- Stress test overall looks good, no worrisome findings  Dominga Ferry MD

## 2023-07-20 ENCOUNTER — Other Ambulatory Visit: Payer: Self-pay | Admitting: Cardiology

## 2023-07-21 ENCOUNTER — Encounter: Payer: Self-pay | Admitting: Podiatry

## 2023-07-21 ENCOUNTER — Ambulatory Visit: Payer: Medicare Other | Admitting: Podiatry

## 2023-07-21 DIAGNOSIS — M79675 Pain in left toe(s): Secondary | ICD-10-CM | POA: Diagnosis not present

## 2023-07-21 DIAGNOSIS — L84 Corns and callosities: Secondary | ICD-10-CM | POA: Diagnosis not present

## 2023-07-21 DIAGNOSIS — E1151 Type 2 diabetes mellitus with diabetic peripheral angiopathy without gangrene: Secondary | ICD-10-CM | POA: Diagnosis not present

## 2023-07-21 DIAGNOSIS — M79674 Pain in right toe(s): Secondary | ICD-10-CM

## 2023-07-21 DIAGNOSIS — G629 Polyneuropathy, unspecified: Secondary | ICD-10-CM

## 2023-07-21 DIAGNOSIS — B351 Tinea unguium: Secondary | ICD-10-CM

## 2023-07-21 NOTE — Progress Notes (Signed)
  Subjective:  Patient ID: Veronica Harrison, female    DOB: Jan 11, 1951,  MRN: [id number]  Veronica Harrison presents to clinic today for at risk footcare. Patient has h/o diabetes, neuropathy and PAD and is seen for  and callus(es) left foot and painful thick toenails that are difficult to trim. Painful toenails interfere with ambulation. Aggravating factors include wearing enclosed shoe gear. Pain is relieved with periodic professional debridement. Painful calluses are aggravated when weightbearing with and without shoegear. Pain is relieved with periodic professional debridement.  Chief Complaint  Patient presents with   Diabetes    DFC BS - DIDN'T TAKE IT TODAY  A1C - 8.3 LVPCP - 05/2023   Callouses    CALLUS BILAT   New problem(s): None.   PCP is Veronica Chimera, MD.  Allergies  Allergen Reactions   Gabapentin Other (See Comments)    Urinary incontinence   Other reaction(s): urinary retention  Other Reaction(s): Not available  Urinary incontinence , Urinary retention- pt had catheter for 4 months, blurred vision ,headache   Other Shortness Of Breath   Penicillins Anaphylaxis   Strawberry Extract Anaphylaxis    Other reaction(s): wheezing   Ace Inhibitors Other (See Comments)    Feeling Faint  Other reaction(s): Pass out Other reaction(s): Unknown   Pseudoephedrine Other (See Comments)    Makes heart race  Other Reaction(s): Not available    Review of Systems: Negative except as noted in the HPI.  Objective: No changes noted in today's physical examination. There were no vitals filed for this visit. Veronica Harrison is a pleasant 72 y.o. female in NAD. AAO x 3.  Vascular Examination: CFT <3 seconds b/l LE. Faintly palpable DP pulses b/l LE. Diminished PT pulse(s) b/l LE. Pedal hair sparse. No pain with calf compression b/l. Lower extremity skin temperature gradient within normal limits. Nonpitting edema noted BLE. Mild varicosities b/l LE. No ischemia or  gangrene noted b/l LE. No cyanosis or clubbing noted b/l LE.  Dermatological Examination: No open wounds b/l LE. Toenails 1-5 b/l elongated, discolored, dystrophic, thickened, crumbly with subungual debris and tenderness to dorsal palpation. Hyperkeratotic lesion(s) right great toe, submet head 1 left foot, submet head 3 left foot, and submet head 5 left foot.  No erythema, no edema, no drainage, no fluctuance.  Neurological Examination: Pt has subjective symptoms of neuropathy. Protective sensation intact 5/5 intact bilaterally with 10g monofilament b/l.  Musculoskeletal Examination: Noted disuse atrophy bilaterally. Hammertoe deformity noted 2-5 b/l. Plantar fat pad atrophy of forefoot area b/l lower extremities. Uses cane for mobility assistance.  Assessment/Plan: 1. Pain due to onychomycosis of toenails of both feet   2. Callus   3. Neuropathy   4. Type II diabetes mellitus with peripheral circulatory disorder [facility])     -Patient was evaluated and treated. All patient's and/or POA's questions/concerns answered on today's visit. -Continue foot and shoe inspections daily. Monitor blood glucose per PCP/Endocrinologist's recommendations. -Patient to continue soft, supportive shoe gear daily. -Mycotic toenails 1-5 bilaterally were debrided in length and girth with sterile nail nippers and dremel without incident. -Callus(es) submet head 1 left foot, submet head 3 left foot, and submet head 5 left foot pared utilizing sterile scalpel blade without complication or incident. Total number debrided =3. -Patient/POA to call should there be question/concern in the interim.   Return in about 3 months (around 10/21/2023).  Freddie Breech, DPM

## 2023-07-26 DIAGNOSIS — F4310 Post-traumatic stress disorder, unspecified: Secondary | ICD-10-CM | POA: Insufficient documentation

## 2023-07-26 DIAGNOSIS — R61 Generalized hyperhidrosis: Secondary | ICD-10-CM | POA: Insufficient documentation

## 2023-07-26 DIAGNOSIS — R002 Palpitations: Secondary | ICD-10-CM | POA: Insufficient documentation

## 2023-07-26 DIAGNOSIS — G629 Polyneuropathy, unspecified: Secondary | ICD-10-CM | POA: Insufficient documentation

## 2023-07-26 DIAGNOSIS — M25559 Pain in unspecified hip: Secondary | ICD-10-CM | POA: Insufficient documentation

## 2023-07-26 DIAGNOSIS — K589 Irritable bowel syndrome without diarrhea: Secondary | ICD-10-CM | POA: Insufficient documentation

## 2023-07-26 DIAGNOSIS — M25519 Pain in unspecified shoulder: Secondary | ICD-10-CM | POA: Insufficient documentation

## 2023-08-06 ENCOUNTER — Other Ambulatory Visit: Payer: Self-pay | Admitting: Cardiology

## 2023-08-13 ENCOUNTER — Other Ambulatory Visit: Payer: Self-pay | Admitting: Family Medicine

## 2023-08-13 DIAGNOSIS — N6489 Other specified disorders of breast: Secondary | ICD-10-CM

## 2023-08-20 ENCOUNTER — Ambulatory Visit (INDEPENDENT_AMBULATORY_CARE_PROVIDER_SITE_OTHER): Payer: Medicare Other

## 2023-08-20 DIAGNOSIS — G629 Polyneuropathy, unspecified: Secondary | ICD-10-CM

## 2023-08-20 DIAGNOSIS — E1151 Type 2 diabetes mellitus with diabetic peripheral angiopathy without gangrene: Secondary | ICD-10-CM | POA: Diagnosis not present

## 2023-08-20 DIAGNOSIS — M2041 Other hammer toe(s) (acquired), right foot: Secondary | ICD-10-CM

## 2023-08-20 DIAGNOSIS — M2042 Other hammer toe(s) (acquired), left foot: Secondary | ICD-10-CM

## 2023-08-20 DIAGNOSIS — L84 Corns and callosities: Secondary | ICD-10-CM

## 2023-08-20 NOTE — Progress Notes (Signed)

## 2023-08-24 ENCOUNTER — Telehealth: Payer: Self-pay | Admitting: Cardiology

## 2023-08-24 DIAGNOSIS — Z79899 Other long term (current) drug therapy: Secondary | ICD-10-CM

## 2023-08-24 NOTE — Telephone Encounter (Signed)
Patient said that Dr. Reuel Boom wants to put her on another BP medication with hydrALAZINE (APRESOLINE) 100 MG tablet. Said the medication is Olmesartan 40 MG Tablet. Want to know if Dr. Wyline Mood is ok with that and wants his recommendation. Took BP this morning and it was 150/72; 87. Please call back

## 2023-08-24 NOTE — Telephone Encounter (Signed)
Her soduim had gotten low in the past to a similar medicine called losartan. She may perhaps tolerate this one better, would be ok to start but would get a bmet in 3 weeks  Dominga Ferry MD

## 2023-08-24 NOTE — Telephone Encounter (Signed)
Patient verbalized understanding and labs were sent to [facility]

## 2023-09-02 ENCOUNTER — Ambulatory Visit
Admission: RE | Admit: 2023-09-02 | Discharge: 2023-09-02 | Disposition: A | Discharge status: Home / Self Care | Payer: Medicare Other | Source: Ambulatory Visit | Attending: Family Medicine | Admitting: Family Medicine

## 2023-09-02 DIAGNOSIS — N6489 Other specified disorders of breast: Secondary | ICD-10-CM

## 2023-09-02 HISTORY — PX: BREAST BIOPSY: SHX20

## 2023-09-14 ENCOUNTER — Ambulatory Visit: Payer: Medicare Other | Admitting: Cardiology

## 2023-09-24 ENCOUNTER — Telehealth: Payer: Self-pay | Admitting: *Deleted

## 2023-09-24 ENCOUNTER — Telehealth: Payer: Self-pay | Admitting: Cardiology

## 2023-09-24 MED ORDER — HYDRALAZINE HCL 100 MG PO TABS: 100.0000 mg | ORAL_TABLET | Freq: Three times a day (TID) | ORAL | 6 refills | Status: DC

## 2023-09-24 NOTE — Telephone Encounter (Signed)
Lesle Chris, LPN 8/65/7846  9:62 AM EDT Back to Top    Notified, copy to pcp.

## 2023-09-24 NOTE — Telephone Encounter (Signed)
Reports PCP wants her to start hydrochlorothiazide 12.5 mg daily for better BP control and wanted to make Dr. Wyline Mood aware. Reports she has not started it yet and it was ordered this past Friday 09/18/2023.

## 2023-09-24 NOTE — Telephone Encounter (Signed)
Hydrochlorothiazide is a good blood pressure medicine but can lead to some low potassium and magnesium levels which can lead to problems with the sotalol. Easier fix would be if willing to try taking the hydralazine three times a day 100mg   Dominga Ferry MD

## 2023-09-24 NOTE — Telephone Encounter (Signed)
Patient informed and verbalized understanding of plan. Patient agrees to taking hydralazine 100 mg TID. Medication profile updated.

## 2023-09-24 NOTE — Telephone Encounter (Signed)
Pt states she would like to speak with Dondra Spry about her BP still running high and her doctor trying to put her on Hydrochlorothiazide. Please advise .

## 2023-09-24 NOTE — Telephone Encounter (Signed)
-----   Message from Dina Rich sent at 09/22/2023  1:34 PM EDT ----- Normal labs, sodium looks fine. Looks to be tolerating new medication  Dominga Ferry MD

## 2023-10-01 ENCOUNTER — Encounter (INDEPENDENT_AMBULATORY_CARE_PROVIDER_SITE_OTHER): Payer: Self-pay | Admitting: *Deleted

## 2023-10-02 ENCOUNTER — Encounter (INDEPENDENT_AMBULATORY_CARE_PROVIDER_SITE_OTHER): Payer: Self-pay | Admitting: *Deleted

## 2023-10-14 ENCOUNTER — Ambulatory Visit (INDEPENDENT_AMBULATORY_CARE_PROVIDER_SITE_OTHER): Payer: Medicare Other | Admitting: Gastroenterology

## 2023-10-16 ENCOUNTER — Other Ambulatory Visit: Payer: Self-pay | Admitting: Cardiology

## 2023-10-21 ENCOUNTER — Encounter: Payer: Self-pay | Admitting: Podiatry

## 2023-10-21 ENCOUNTER — Ambulatory Visit (INDEPENDENT_AMBULATORY_CARE_PROVIDER_SITE_OTHER): Payer: Medicare Other | Admitting: Podiatry

## 2023-10-21 DIAGNOSIS — B351 Tinea unguium: Secondary | ICD-10-CM

## 2023-10-21 DIAGNOSIS — G629 Polyneuropathy, unspecified: Secondary | ICD-10-CM | POA: Diagnosis not present

## 2023-10-21 DIAGNOSIS — L84 Corns and callosities: Secondary | ICD-10-CM

## 2023-10-21 DIAGNOSIS — M79675 Pain in left toe(s): Secondary | ICD-10-CM | POA: Diagnosis not present

## 2023-10-21 DIAGNOSIS — E1151 Type 2 diabetes mellitus with diabetic peripheral angiopathy without gangrene: Secondary | ICD-10-CM | POA: Diagnosis not present

## 2023-10-21 DIAGNOSIS — M79674 Pain in right toe(s): Secondary | ICD-10-CM

## 2023-10-21 NOTE — Progress Notes (Signed)
Subjective:  Patient ID: Veronica Harrison, female    DOB: 1951/12/16,  MRN: [id number]  Veronica Harrison presents to clinic today for at risk footcare. Patient has h/o diabetes, neuropathy and PAD and is seen for  and callus(es) left foot and painful thick toenails that are difficult to trim. Painful toenails interfere with ambulation. Aggravating factors include wearing enclosed shoe gear. Pain is relieved with periodic professional debridement. Painful calluses are aggravated when weightbearing with and without shoegear. Pain is relieved with periodic professional debridement.  Patient states she hit her right great toe and the nail lifted up. Denies any drainage or swelling. Chief Complaint  Patient presents with   [facility]    [facility] BS-not taken A1c -7.5 07/2023 PCP appt 12/2023    PCP is Richardean Chimera, MD.  Allergies  Allergen Reactions   Gabapentin Other (See Comments)    Urinary incontinence   Other reaction(s): urinary retention  Other Reaction(s): Not available  Urinary incontinence , Urinary retention- pt had catheter for 4 months, blurred vision ,headache   Other Shortness Of Breath   Penicillins Anaphylaxis   Strawberry Extract Anaphylaxis    Other reaction(s): wheezing   Ace Inhibitors Other (See Comments)    Feeling Faint  Other reaction(s): Pass out Other reaction(s): Unknown   Pseudoephedrine Other (See Comments)    Makes heart race  Other Reaction(s): Not available    Review of Systems: Negative except as noted in the HPI.  Objective: There were no vitals filed for this visit. Veronica Harrison is a pleasant 72 y.o. female obese in NAD. AAO x 3.  Vascular Examination: CFT <3 seconds b/l LE. Faintly palpable DP pulses b/l LE. Diminished PT pulse(s) b/l LE. Pedal hair sparse. No pain with calf compression b/l. Lower extremity skin temperature gradient within normal limits. Nonpitting edema noted BLE. Mild varicosities b/l LE. No ischemia or gangrene noted  b/l LE. No cyanosis or clubbing noted b/l LE.  Dermatological Examination: No open wounds b/l LE. Toenails 1-5 b/l elongated, discolored, dystrophic, thickened, crumbly with subungual debris and tenderness to dorsal palpation.   Hyperkeratotic lesion(s) right great toe, submet head 1 left foot, submet head 3 left foot, and submet head 5 left foot.  No erythema, no edema, no drainage, no fluctuance.  There is noted onchyolysis of nailplate of right great toe.  The nailbed(s) remain(s) intact. There is no erythema, no edema, no drainage, no underlying fluctuance.  Multiple well healed surgical scars b/l LE.  Neurological Examination: Pt has subjective symptoms of neuropathy. Protective sensation intact 5/5 intact bilaterally with 10g monofilament b/l.  Musculoskeletal Examination: Noted disuse atrophy bilaterally. Hammertoe deformity noted 2-5 b/l. Plantar fat pad atrophy of forefoot area b/l lower extremities. Uses cane for mobility assistance.  Assessment/Plan: 1. Pain due to onychomycosis of toenails of both feet   2. Callus   3. Neuropathy   4. Type II diabetes mellitus with peripheral circulatory disorder (HCC)     -Consent given for treatment as described below: -Examined patient. -Continue foot and shoe inspections daily. Monitor blood glucose per PCP/Endocrinologist's recommendations. -Continue supportive shoe gear daily. -Toenails were debrided in length and girth 2-5 bilaterally and left great toe with sterile nail nippers and dremel without iatrogenic bleeding.  -Loose nailplate right great toe gently debrided en toto. Digital nailbed cleansed with alcohol. Triple antibiotic ointment applied to nailbed followed by light dressing. No further treatment required by patient. -Patient/POA to call should there be question/concern in the interim.   Return  in about 3 months (around 01/21/2024).  Freddie Breech, DPM

## 2023-10-22 ENCOUNTER — Ambulatory Visit (INDEPENDENT_AMBULATORY_CARE_PROVIDER_SITE_OTHER): Payer: Medicare Other | Admitting: Gastroenterology

## 2023-10-22 ENCOUNTER — Encounter (INDEPENDENT_AMBULATORY_CARE_PROVIDER_SITE_OTHER): Payer: Self-pay | Admitting: Gastroenterology

## 2023-10-22 VITALS — BP 130/68 | HR 70 | Temp 97.5°F | Ht 64.0 in | Wt 216.0 lb

## 2023-10-22 DIAGNOSIS — Z1211 Encounter for screening for malignant neoplasm of colon: Secondary | ICD-10-CM | POA: Insufficient documentation

## 2023-10-22 DIAGNOSIS — R195 Other fecal abnormalities: Secondary | ICD-10-CM

## 2023-10-22 NOTE — Patient Instructions (Addendum)
chedule colonoscopy

## 2023-10-22 NOTE — Progress Notes (Signed)
Katrinka Blazing, M.D. Gastroenterology & Hepatology [facility] Gastroenterology [address] [place], [place] [address] Primary Care Physician: Richardean Chimera, MD [address] [place] [address]  Referring MD: PCP  Chief Complaint: Positive Cologuard  History of Present Illness: Veronica Harrison is a 72 y.o. female medical history of atrial fibrillation, Graves' disease, hypertension, psoriatic arthritis, diabetes, who presents for evaluation of positive Cologuard.  Patient had a positive Cologuard on 09/22/2023.  The patient denies having any vomiting, fever, chills, hematochezia, melena, hematemesis, abdominal distention, abdominal pain, diarrhea, jaundice, pruritus or weight loss. Endorses having some occasional nausea.  She takes dulcolax every night and fiber in meals, which keeps her regular with her Bms. Depending on what she eat she may have bloating episodes.  Most recent labs from 08/19/2023 showed a sodium 139, potassium 4.1, creatinine 0.71, BUN 8, total bilirubin 0.5, ALT 16, AST 11, alkaline phosphatase 63, CBC with WBC 7.8, hemoglobin 12.3, platelets 445, TSH 1.66  Last UJW:JXBJYNWG 2008, was told she had a hiatal hernia Last Colonoscopy:2016, performed at [facility] - perofrmed by Dr. Bufford Buttner No report available but states had 3 polyps that were "benign".  FHx: neg for any gastrointestinal/liver disease, no malignancies Social: neg smoking, alcohol or illicit drug use Surgical: hysterectomy, oophorectomy  Past Medical History: Past Medical History:  Diagnosis Date   Atrial fibrillation (HCC)    Chronic pain    Graves' disease    Hypertension    Psoriatic arthritis (HCC)    Type 2 diabetes mellitus (HCC)    Vitamin D deficiency     Past Surgical History: Past Surgical History:  Procedure Laterality Date   ABDOMINAL HYSTERECTOMY     BREAST BIOPSY Right 09/02/2023   MM RT BREAST BX W LOC DEV 1ST LESION IMAGE BX SPEC STEREO  GUIDE 09/02/2023 [facility]   CHOLECYSTECTOMY     TOTAL HIP ARTHROPLASTY  01/29/2018   TOTAL KNEE ARTHROPLASTY Right    VARICOSE VEIN SURGERY Left     Family History: Family History  Problem Relation Age of Onset   Diabetes Other    CAD Other     Social History: Social History   Tobacco Use  Smoking Status Former   Current packs/day: 0.00   Average packs/day: 1 pack/day for 34.7 years (34.7 ttl pk-yrs)   Types: Cigarettes   Start date: 01/29/1970   Quit date: 09/30/2004   Years since quitting: 19.0  Smokeless Tobacco Never  Tobacco Comments   10 years ago   Social History   Substance and Sexual Activity  Alcohol Use No   Alcohol/week: 0.0 standard drinks of alcohol   Social History   Substance and Sexual Activity  Drug Use No    Allergies: Allergies  Allergen Reactions   Gabapentin Other (See Comments)    Urinary incontinence   Other reaction(s): urinary retention  Other Reaction(s): Not available  Urinary incontinence , Urinary retention- pt had catheter for 4 months, blurred vision ,headache   Other Shortness Of Breath   Penicillins Anaphylaxis   Strawberry Extract Anaphylaxis    Other reaction(s): wheezing   Ace Inhibitors Other (See Comments)    Feeling Faint  Other reaction(s): Pass out Other reaction(s): Unknown   Pseudoephedrine Other (See Comments)    Makes heart race  Other Reaction(s): Not available    Medications: Current Outpatient Medications  Medication Sig Dispense Refill   acetaminophen (TYLENOL) 500 MG tablet Take 500 mg by mouth 2 (two) times daily as needed.  albuterol (PROVENTIL) (2.5 MG/3ML) 0.083% nebulizer solution Take 2.5 mg by nebulization every 6 (six) hours as needed for wheezing or shortness of breath.     apixaban (ELIQUIS) 5 MG TABS tablet Take 1 tablet (5 mg total) by mouth 2 (two) times daily. 28 tablet 0   bisacodyl (DULCOLAX) 5 MG EC tablet Take 5 mg by mouth at bedtime.     Blood Pressure KIT 1 Device  by Does not apply route daily. 1 each 0   Chlorphen-Pseudoephed-APAP (CORICIDIN D PO) Take 2 tablets by mouth once as needed (allegies).     clindamycin (CLEOCIN) 150 MG capsule 4 capsules Orally prior to dental visits     clobetasol ointment (TEMOVATE) 0.05 % Apply 1 Application topically as needed (for rash).     clonazePAM (KLONOPIN) 1 MG tablet Take 1 mg by mouth at bedtime.     cyanocobalamin (,VITAMIN B-12,) 1000 MCG/ML injection Inject 1,000 mcg into the muscle every 30 (thirty) days.     diclofenac Sodium (VOLTAREN) 1 % GEL SMARTSIG:3 Topical 10 Times Daily PRN     diltiazem (CARDIZEM) 30 MG tablet TAKE 1 TABLET TWICE DAILY -- MAY TAKE ADDITIONAL TABLET AS NEEDED FOR PALPITATIONS OR SBP >150 90 tablet 1   estradiol (ESTRACE) 0.1 MG/GM vaginal cream Insert pea-sized amount of cream per vagina every other night     Fluticasone-Salmeterol (ADVAIR) 250-50 MCG/DOSE AEPB Inhale 1 puff into the lungs as needed (wheezing and SOB).     folic acid (FOLVITE) 1 MG tablet Take 1 mg by mouth daily.     furosemide (LASIX) 20 MG tablet TAKE 1 TABLET BY MOUTH DAILY AS NEEDED FOR EDEMA OR FLUID. 30 tablet 3   glucose blood test strip 1 each by Other route as needed for other. Use as instructed     hydrALAZINE (APRESOLINE) 100 MG tablet Take 1 tablet (100 mg total) by mouth 3 (three) times daily. 90 tablet 6   ipratropium (ATROVENT HFA) 17 MCG/ACT inhaler Inhale 2 puffs into the lungs every 6 (six) hours.     Lancets 28G MISC 28 g by Does not apply route daily.     levothyroxine (SYNTHROID) 150 MCG tablet 1 tablet on an empty stomach in the morning Orally Once a day     metFORMIN (GLUCOPHAGE) 500 MG tablet Take 1,000 mg by mouth 2 (two) times daily with a meal.     methotrexate (RHEUMATREX) 2.5 MG tablet Take 2.5 mg by mouth once a week.     Misc. Devices (PULSE OXIMETER FOR FINGER) MISC 1 Device by Does not apply route daily. 1 each 0   mupirocin ointment (BACTROBAN) 2 % Apply 1 application topically daily  as needed.      nystatin cream (MYCOSTATIN) Apply 1 application topically 3 (three) times daily.     ondansetron (ZOFRAN) 4 MG tablet Take by mouth.     oxyCODONE (OXYCONTIN) 30 MG 12 hr tablet Take 30 mg by mouth 5 (five) times daily.     potassium chloride SA (KLOR-CON M) 20 MEQ tablet TAKE 1 TABLET DAILY AS NEEDED (ONLY ON DAYS YOU TAKE YOUR LASIX) 30 tablet 3   Risankizumab-rzaa (SKYRIZI PEN) 150 MG/ML SOAJ 150mg  Subcutaneous every 12 weeks     sotalol (BETAPACE) 120 MG tablet take (1) tablet twice daily. 60 tablet 4   triamcinolone (NASACORT) 55 MCG/ACT AERO nasal inhaler Place 1 spray into the nose daily.     Vitamin D, Ergocalciferol, (DRISDOL) 1.25 MG (50000 UNIT) CAPS capsule Take 50,000  Units by mouth every 7 (seven) days.     No current facility-administered medications for this visit.    Review of Systems: GENERAL: negative for malaise, night sweats HEENT: No changes in hearing or vision, no nose bleeds or other nasal problems. NECK: Negative for lumps, goiter, pain and significant neck swelling RESPIRATORY: Negative for cough, wheezing CARDIOVASCULAR: Negative for chest pain, leg swelling, palpitations, orthopnea GI: SEE HPI MUSCULOSKELETAL: Negative for joint pain or swelling, back pain, and muscle pain. SKIN: Negative for lesions, rash PSYCH: Negative for sleep disturbance, mood disorder and recent psychosocial stressors. HEMATOLOGY Negative for prolonged bleeding, bruising easily, and swollen nodes. ENDOCRINE: Negative for cold or heat intolerance, polyuria, polydipsia and goiter. NEURO: negative for tremor, gait imbalance, syncope and seizures. The remainder of the review of systems is noncontributory.   Physical Exam: BP 130/68 (BP Location: Left Arm, Patient Position: Sitting, Cuff Size: Large)   Pulse 70   Temp (!) 97.5 F (36.4 C) (Temporal)   Ht 5\' 4"  (1.626 m)   Wt 216 lb (98 kg)   BMI 37.08 kg/m  GENERAL: The patient is AO x3, in no acute  distress. HEENT: Head is normocephalic and atraumatic. EOMI are intact. Mouth is well hydrated and without lesions. NECK: Supple. No masses LUNGS: Clear to auscultation. No presence of rhonchi/wheezing/rales. Adequate chest expansion HEART: RRR, normal s1 and s2. ABDOMEN: Soft, nontender, no guarding, no peritoneal signs, and nondistended. BS +. No masses. EXTREMITIES: Without any cyanosis, clubbing, rash, lesions or edema. NEUROLOGIC: AOx3, no focal motor deficit. SKIN: no jaundice, no rashes   Imaging/Labs: as above  I personally reviewed and interpreted the available labs, imaging and endoscopic files.  Impression and Plan: Veronica Harrison is a 72 y.o. female medical history of atrial fibrillation, Graves' disease, hypertension, psoriatic arthritis, diabetes, who presents for evaluation of positive Cologuard. Patient does not have any high risk factors for colorectal cancer malignancy and has been asymptomatic. Discussed cologuard test results in detail, specifically what it means when the test is positive or negative.  Discussed that there is a possibility that even when the test is positive there may not be a polyp found on colonoscopy. More than 50% of the office visit was dedicated to discussing the procedure, including the day of and risks involved. Patient understands what the procedure involves including the benefits and any risks. Patient understands alternatives to the proposed procedure. Risks including (but not limited to) bleeding, tearing of the lining (perforation), rupture of adjacent organs, problems with heart and lung function, infection, and medication reactions. A small percentage of complications may require surgery, hospitalization, repeat endoscopic procedure, and/or transfusion. A small percentage of polyps and other tumors may not be seen.  - Schedule colonoscopy  All questions were answered.      Katrinka Blazing, MD Gastroenterology and Hepatology [facility] Gastroenterology

## 2023-10-23 ENCOUNTER — Telehealth (INDEPENDENT_AMBULATORY_CARE_PROVIDER_SITE_OTHER): Payer: Self-pay | Admitting: Gastroenterology

## 2023-10-23 NOTE — Telephone Encounter (Signed)
    10/23/23  Veronica Harrison 1951-05-24  What type of surgery is being performed? Colonoscopy   When is surgery scheduled? TBD  Clearance to hold Elliquis for 2 days prior  Name of physician performing surgery?  Dr. Katrinka Blazing [facility] Gastroenterology at [facility] Phone: [phone] Fax: [phone]  Anethesia type (none, local, MAC, general)? MAC

## 2023-10-26 NOTE — Telephone Encounter (Signed)
Patient with diagnosis of afib on Eliquis for anticoagulation.    Procedure: colonoscopy Date of procedure: TBD  CHA2DS2-VASc Score = 4  This indicates a 4.8% annual risk of stroke. The patient's score is based upon: CHF History: 0 HTN History: 1 Diabetes History: 1 Stroke History: 0 Vascular Disease History: 0 Age Score: 1 Gender Score: 1  CrCl 51mL/min using adjusted body weight Platelet count 432K  Per office protocol, patient can hold Eliquis for 2 days prior to procedure as requested.    **This guidance is not considered finalized until pre-operative APP has relayed final recommendations.**

## 2023-10-26 NOTE — Telephone Encounter (Signed)
   Patient Name: Veronica Harrison  DOB: 1951/09/18 MRN: [id number]  Primary Cardiologist: Dina Rich, MD  Clinical pharmacists have reviewed the patient's past medical history, labs, and current medications as part of preoperative protocol coverage. The following recommendations have been made:   Per office protocol, patient can hold Eliquis for 2 days prior to procedure as requested.    I will route this recommendation to the requesting party via Epic fax function and remove from pre-op pool.  Please call with questions.  Napoleon Form, Leodis Rains, NP 10/26/2023, 11:13 AM

## 2023-11-16 ENCOUNTER — Ambulatory Visit: Payer: Medicare Other | Attending: Cardiology | Admitting: Cardiology

## 2023-11-16 ENCOUNTER — Encounter: Payer: Self-pay | Admitting: Cardiology

## 2023-11-16 VITALS — BP 132/62 | HR 70 | Ht 64.0 in | Wt 218.8 lb

## 2023-11-16 DIAGNOSIS — I10 Essential (primary) hypertension: Secondary | ICD-10-CM | POA: Insufficient documentation

## 2023-11-16 DIAGNOSIS — I4891 Unspecified atrial fibrillation: Secondary | ICD-10-CM | POA: Insufficient documentation

## 2023-11-16 DIAGNOSIS — D6869 Other thrombophilia: Secondary | ICD-10-CM | POA: Diagnosis present

## 2023-11-16 DIAGNOSIS — Z79899 Other long term (current) drug therapy: Secondary | ICD-10-CM | POA: Insufficient documentation

## 2023-11-16 MED ORDER — SPIRONOLACTONE 25 MG PO TABS: 25.0000 mg | ORAL_TABLET | Freq: Every day | ORAL | 6 refills | Status: DC

## 2023-11-16 MED ORDER — HYDRALAZINE HCL 100 MG PO TABS: 100.0000 mg | ORAL_TABLET | Freq: Two times a day (BID) | ORAL | Status: DC

## 2023-11-16 NOTE — Patient Instructions (Addendum)
Medication Instructions:   Decrease Hydralazine to 100mg  twice a day   Begin Spironolactone 25mg  daily   Continue all other medications.     Labwork:  BMET - order given  Please do in 2 weeks Office will contact with results via phone, letter or mychart.     Testing/Procedures:  none  Follow-Up:  6 months   Any Other Special Instructions Will Be Listed Below (If Applicable).  Please update the office with BP readings in 2 weeks   If you need a refill on your cardiac medications before your next appointment, please call your pharmacy.
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
## 2023-11-17 ENCOUNTER — Encounter: Payer: Self-pay | Admitting: *Deleted

## 2023-11-19 NOTE — Telephone Encounter (Signed)
Attempted to contact pt to get TCS scheduled with Dr.Castaneda (ASA 3). Home number is busy. Contacted husband and he states to call home number. Attempted home number once more and it is busy

## 2023-12-09 ENCOUNTER — Telehealth (INDEPENDENT_AMBULATORY_CARE_PROVIDER_SITE_OTHER): Payer: Self-pay | Admitting: Gastroenterology
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
## 2023-12-11 ENCOUNTER — Encounter: Payer: Self-pay | Admitting: Cardiology

## 2023-12-15 ENCOUNTER — Telehealth: Payer: Self-pay | Admitting: Cardiology

## 2023-12-15 NOTE — Telephone Encounter (Signed)
° ° °

## 2023-12-21 ENCOUNTER — Other Ambulatory Visit: Payer: Self-pay | Admitting: Cardiology

## 2023-12-24 NOTE — Telephone Encounter (Signed)
Thanks for the update

## 2023-12-28 ENCOUNTER — Telehealth: Payer: Self-pay | Admitting: Cardiology

## 2023-12-28 MED ORDER — SPIRONOLACTONE 25 MG PO TABS: 37.5000 mg | ORAL_TABLET | Freq: Every day | ORAL | Status: DC

## 2023-12-28 NOTE — Telephone Encounter (Signed)
Patient walked in wanting to make sure the lab results Dr. Wyline Mood reviewed and states is normal was from December.  Mrs. Veronica Harrison states her potassium was high and sodium is low.  She also wants to make sure there are no changes to Spironolactone 25mg .

## 2023-12-28 NOTE — Telephone Encounter (Signed)
Patient informed and verbalized understanding of plan. Patient aggred to med increase on aldactone as previously noted by JB in lab results note.

## 2024-01-04 ENCOUNTER — Other Ambulatory Visit: Payer: Self-pay | Admitting: *Deleted

## 2024-01-04 MED ORDER — APIXABAN 5 MG PO TABS: 5.0000 mg | ORAL_TABLET | Freq: Two times a day (BID) | ORAL | 3 refills | Status: DC

## 2024-01-07 ENCOUNTER — Telehealth: Payer: Self-pay | Admitting: Cardiology

## 2024-01-07 NOTE — Telephone Encounter (Signed)
 Pt, Spouse Danelle Berry came into [place] office and stated that [facility] received the faxed eliquis forms but they were blurry. They ask for [place] to refax them if we can!

## 2024-01-07 NOTE — Telephone Encounter (Signed)
Faxed again at patient's request.

## 2024-01-18 ENCOUNTER — Other Ambulatory Visit: Payer: Self-pay | Admitting: Cardiology

## 2024-02-03 ENCOUNTER — Ambulatory Visit (INDEPENDENT_AMBULATORY_CARE_PROVIDER_SITE_OTHER): Payer: Medicare Other | Admitting: Podiatry

## 2024-02-03 ENCOUNTER — Encounter: Payer: Self-pay | Admitting: Podiatry

## 2024-02-03 VITALS — Ht 64.0 in

## 2024-02-03 DIAGNOSIS — M79674 Pain in right toe(s): Secondary | ICD-10-CM | POA: Diagnosis not present

## 2024-02-03 DIAGNOSIS — G629 Polyneuropathy, unspecified: Secondary | ICD-10-CM | POA: Diagnosis not present

## 2024-02-03 DIAGNOSIS — L84 Corns and callosities: Secondary | ICD-10-CM | POA: Diagnosis not present

## 2024-02-03 DIAGNOSIS — E1151 Type 2 diabetes mellitus with diabetic peripheral angiopathy without gangrene: Secondary | ICD-10-CM | POA: Diagnosis not present

## 2024-02-03 DIAGNOSIS — M79675 Pain in left toe(s): Secondary | ICD-10-CM | POA: Diagnosis not present

## 2024-02-03 DIAGNOSIS — B351 Tinea unguium: Secondary | ICD-10-CM

## 2024-02-11 NOTE — Progress Notes (Signed)
 Subjective:  Patient ID: Veronica Harrison, female    DOB: [birth year],  MRN: [id number]  Veronica Harrison presents to clinic today for at risk footcare. Patient has h/o diabetes, neuropathy and PAD and is seen for  and callus(es) of both feet and painful thick toenails that are difficult to trim. Painful toenails interfere with ambulation. Aggravating factors include wearing enclosed shoe gear. Pain is relieved with periodic professional debridement. Painful calluses are aggravated when weightbearing with and without shoegear. Pain is relieved with periodic professional debridement.  Chief Complaint  Patient presents with   [facility]    She is here for a nail trim, her PCP is Dr. Toribio and last seen in jan, A1C was 8. She hit the lower part of her left leg and wants you to look at a spot that she hit on the dishwasher a couple of days ago.    PCP is Toribio Jerel MATSU, MD.  Allergies  Allergen Reactions   Gabapentin Other (See Comments)    Urinary incontinence   Other reaction(s): urinary retention  Other Reaction(s): Not available  Urinary incontinence , Urinary retention- pt had catheter for 4 months, blurred vision ,headache   Other Shortness Of Breath   Penicillins Anaphylaxis   Strawberry Extract Anaphylaxis    Other reaction(s): wheezing   Ace Inhibitors Other (See Comments)    Feeling Faint  Other reaction(s): Pass out Other reaction(s): Unknown   Pseudoephedrine Other (See Comments)    Makes heart race  Other Reaction(s): Not available    Review of Systems: Negative except as noted in the HPI.  Objective:  There were no vitals filed for this visit. Veronica Harrison is a pleasant 73 y.o. female in NAD. AAO x 3.  Vascular Examination: CFT <3 seconds b/l LE. Faintly palpable DP pulses b/l LE. Diminished PT pulse(s) b/l LE. Pedal hair sparse. No pain with calf compression b/l. Lower extremity skin temperature gradient within normal limits. Nonpitting edema noted BLE.  Mild varicosities b/l LE. No ischemia or gangrene noted b/l LE. No cyanosis or clubbing noted b/l LE.  Dermatological Examination: Healing abrasion anterior aspect left lower leg with scab overlying it. No erythema, no edema, no active drainage, no odor. Toenails 1-5 b/l elongated, discolored, dystrophic, thickened, crumbly with subungual debris and tenderness to dorsal palpation.   Hyperkeratotic lesion(s) right great toe, submet head 1 left foot, submet head 3 left foot, and submet head 5 left foot.  No erythema, no edema, no drainage, no fluctuance.  There is noted onchyolysis of nailplate of right great toe.  The nailbed(s) remain(s) intact. There is no erythema, no edema, no drainage, no underlying fluctuance.  Multiple well healed surgical scars b/l LE.  Neurological Examination: Pt has subjective symptoms of neuropathy. Protective sensation intact 5/5 intact bilaterally with 10g monofilament b/l.  Musculoskeletal Examination: Noted disuse atrophy bilaterally. Hammertoe deformity noted 2-5 b/l. Plantar fat pad atrophy of forefoot area b/l lower extremities. Uses cane for mobility assistance.  Assessment/Plan: 1. Pain due to onychomycosis of toenails of both feet   2. Callus   3. Neuropathy   4. Type II diabetes mellitus with peripheral circulatory disorder [facility])     Patient was evaluated and treated. All patient's and/or POA's questions/concerns addressed on today's visit. Mycotic toenails 1-5 debrided in length and girth without incident. Callus(es) right great toe, submet head 1 left foot, submet head 3 left foot, and submet head 5 left foot pared with sharp debridement without incident. Continue daily foot inspections and  monitor blood glucose per PCP/Endocrinologist's recommendations. Continue soft, supportive shoe gear daily. Report any pedal injuries to medical professional. Call office if there are any questions/concerns. -Continue daily application of topical antibiotic cream to  left leg. Left leg wound is healing. Call office if condition changes. -Patient/POA to call should there be question/concern in the interim.   Return in about 3 months (around 05/02/2024).  Delon LITTIE Merlin, DPM      [place] LOCATION: [address], [place] [address]                   Office [phone]   [facility] LOCATION: [address] [place], [place] [address] Office [phone]

## 2024-02-17 ENCOUNTER — Other Ambulatory Visit: Payer: Self-pay | Admitting: Cardiology

## 2024-03-03 ENCOUNTER — Telehealth: Payer: Self-pay | Admitting: Cardiology

## 2024-03-03 DIAGNOSIS — I4891 Unspecified atrial fibrillation: Secondary | ICD-10-CM

## 2024-03-03 MED ORDER — APIXABAN 5 MG PO TABS: 5.0000 mg | ORAL_TABLET | Freq: Two times a day (BID) | ORAL | 1 refills | Status: DC

## 2024-03-03 NOTE — Telephone Encounter (Signed)
 Patient walked into the office today stating that she would like for her  Eliquis RX sent to [facility]. She has a new prescription insurance care thru [place]  Card was scanned into the system.

## 2024-03-03 NOTE — Addendum Note (Signed)
 Addended by: Betsy Coder B on: 03/03/2024 03:57 PM   Modules accepted: Orders

## 2024-03-03 NOTE — Telephone Encounter (Signed)
 Prescription refill request for Eliquis received. Indication: Afib  Last office visit: 11/16/23 ([place])  Scr: 0.85 (02/15/24)  Age: 73 Weight: 99.2kg  Appropriate dose. Refill sent.

## 2024-03-08 ENCOUNTER — Telehealth: Payer: Self-pay | Admitting: Cardiology

## 2024-03-08 ENCOUNTER — Other Ambulatory Visit: Payer: Self-pay | Admitting: Cardiology

## 2024-03-08 DIAGNOSIS — I4891 Unspecified atrial fibrillation: Secondary | ICD-10-CM

## 2024-03-08 NOTE — Telephone Encounter (Signed)
 Eliquis 5mg  refill request received. Patient is 73 years old, weight-99.2kg, Crea-0.84 on 04/14/23 via [employer], Diagnosis-Afib, and last seen by Dr. Wyline Mood on 11/16/23. Dose is appropriate based on dosing criteria. Will send in refill to requested pharmacy.

## 2024-03-08 NOTE — Telephone Encounter (Signed)
*  STAT* If patient is at the pharmacy, call can be transferred to refill team.   1. Which medications need to be refilled? (please list name of each medication and dose if known) apixaban (ELIQUIS) 5 MG TABS tablet   2. Which pharmacy/location (including street and city if local pharmacy) is medication to be sent to?  [facility] - [place], [place] - [address]      3. Do they need a 30 day or 90 day supply? 30 day

## 2024-03-09 NOTE — Telephone Encounter (Signed)
 Rx refilled yesterday.

## 2024-03-16 ENCOUNTER — Other Ambulatory Visit: Payer: Self-pay | Admitting: Cardiology

## 2024-04-27 ENCOUNTER — Telehealth: Payer: Self-pay | Admitting: Cardiology

## 2024-04-27 NOTE — Telephone Encounter (Signed)
   Pre-operative Risk Assessment    Patient Name: Veronica Harrison  DOB: 11-08-1951 MRN: [id number]      Request for Surgical Clearance    Procedure:   Pelvic Prolapse Surgery  Date of Surgery:  Clearance TBD                                 Surgeon:  Serita Danes, MD Surgeon's Group or Practice Name:  [facility] Phone number:  [phone] Fax number:  [phone]   Type of Clearance Requested:   - Medical  - Pharmacy:  Hold Apixaban  (Eliquis ) Needs to be off Eliquis  5 to 7 days prior and likely a week afterwards   Type of Anesthesia:  Not Indicated   Additional requests/questions:    Rosea Conch   04/27/2024, 4:52 PM

## 2024-05-02 ENCOUNTER — Telehealth: Payer: Self-pay | Admitting: *Deleted

## 2024-05-02 NOTE — Telephone Encounter (Signed)
 Patient with diagnosis of Afib on Eliquis  for anticoagulation.    Procedure: Pelvic Prolapse Surgery  Date of procedure: TBD   CHA2DS2-VASc Score = 4   This indicates a 4.8% annual risk of stroke. The patient's score is based upon: CHF History: 0 HTN History: 1 Diabetes History: 1 Stroke History: 0 Vascular Disease History: 0 Age Score: 1 Gender Score: 1     CrCl 68 mL/min Platelet count 442 K  Patient has not  had an Afib/aflutter ablation within the last 3 months or DCCV within the last 30 days    Per office protocol, patient can hold Eliquis  for 3 days prior to procedure.  Please resume Eliquis  as soon as possible postprocedure, at the discretion of the surgeon    **This guidance is not considered finalized until pre-operative APP has relayed final recommendations.**

## 2024-05-02 NOTE — Telephone Encounter (Signed)
 S/w the pt and she agrees to tele preop appt 05/09/24. Med rec and consent are done. Pt also tells me that her BP meds are not holding her BP and that by the afternoon her BP is running 160/90. I assured the pt in regard to her BP I will send a message to Dr. Amanda Jungling and his nurse and they will call her about her BP.   Med rec and consent are done.   We are still waiting to hear back from surgeon's office about Eliquis  hold time, see notes from earlier today.

## 2024-05-02 NOTE — Telephone Encounter (Signed)
 S/w the pt and she agrees to tele preop appt 05/09/24. Med rec and consent are done. Pt also tells me that her BP meds are not holding her BP and that by the afternoon her BP is running 160/90. I assured the pt in regard to her BP I will send a message to Dr. Amanda Jungling and his nurse and they will call her about her BP.   Med rec and consent are done.   We are still waiting to hear back from surgeon's office about Eliquis  hold time, see notes from earlier today.      Patient Consent for Virtual Visit        Veronica Harrison has provided verbal consent on 05/02/2024 for a virtual visit (video or telephone).   CONSENT FOR VIRTUAL VISIT FOR:  Veronica Harrison  By participating in this virtual visit I agree to the following:  I hereby voluntarily request, consent and authorize [facility] and its employed or contracted physicians, physician assistants, nurse practitioners or other licensed health care professionals (the Practitioner), to provide me with telemedicine health care services (the "Services") as deemed necessary by the treating Practitioner. I acknowledge and consent to receive the Services by the Practitioner via telemedicine. I understand that the telemedicine visit will involve communicating with the Practitioner through live audiovisual communication technology and the disclosure of certain medical information by electronic transmission. I acknowledge that I have been given the opportunity to request an in-person assessment or other available alternative prior to the telemedicine visit and am voluntarily participating in the telemedicine visit.  I understand that I have the right to withhold or withdraw my consent to the use of telemedicine in the course of my care at any time, without affecting my right to future care or treatment, and that the Practitioner or I may terminate the telemedicine visit at any time. I understand that I have the right to inspect all information  obtained and/or recorded in the course of the telemedicine visit and may receive copies of available information for a reasonable fee.  I understand that some of the potential risks of receiving the Services via telemedicine include:  Delay or interruption in medical evaluation due to technological equipment failure or disruption; Information transmitted may not be sufficient (e.g. poor resolution of images) to allow for appropriate medical decision making by the Practitioner; and/or  In rare instances, security protocols could fail, causing a breach of personal health information.  Furthermore, I acknowledge that it is my responsibility to provide information about my medical history, conditions and care that is complete and accurate to the best of my ability. I acknowledge that Practitioner's advice, recommendations, and/or decision may be based on factors not within their control, such as incomplete or inaccurate data provided by me or distortions of diagnostic images or specimens that may result from electronic transmissions. I understand that the practice of medicine is not an exact science and that Practitioner makes no warranties or guarantees regarding treatment outcomes. I acknowledge that a copy of this consent can be made available to me via my patient portal [facility] MyChart), or I can request a printed copy by calling the office of [facility].    I understand that my insurance will be billed for this visit.   I have read or had this consent read to me. I understand the contents of this consent, which adequately explains the benefits and risks of the Services being provided via telemedicine.  I have been  provided ample opportunity to ask questions regarding this consent and the Services and have had my questions answered to my satisfaction. I give my informed consent for the services to be provided through the use of telemedicine in my medical care

## 2024-05-02 NOTE — Telephone Encounter (Signed)
 Primary Cardiologist:Branch, Arlyce Lambert, MD   Preoperative team, please contact this patient and set up a phone call appointment for further preoperative risk assessment. Please obtain consent and complete medication review. Thank you for your help.   **See notes about Eliquis  hold.  I also confirmed the patient resides in the state of [place] . As per [facility] Medical Board telemedicine laws, the patient must reside in the state in which the provider is licensed.   Gerldine Koch, NP-C  05/02/2024, 12:27 PM [address], [address] [place], [place] [address] Office [phone] Fax [phone]

## 2024-05-02 NOTE — Telephone Encounter (Signed)
 I s/w surgeon office and left verbal message or nurse/surgery scheduler to call back as to address the hold time for Eliquis .

## 2024-05-06 NOTE — Telephone Encounter (Signed)
 Patient called to cancel the tele visit appointment stating her procedure has been postponed.

## 2024-05-09 ENCOUNTER — Ambulatory Visit

## 2024-05-30 ENCOUNTER — Other Ambulatory Visit: Payer: Self-pay | Admitting: Cardiology

## 2024-06-08 ENCOUNTER — Ambulatory Visit (INDEPENDENT_AMBULATORY_CARE_PROVIDER_SITE_OTHER): Payer: Medicare Other | Admitting: Podiatry

## 2024-06-08 ENCOUNTER — Encounter: Payer: Self-pay | Admitting: Podiatry

## 2024-06-08 DIAGNOSIS — M79675 Pain in left toe(s): Secondary | ICD-10-CM | POA: Diagnosis not present

## 2024-06-08 DIAGNOSIS — B351 Tinea unguium: Secondary | ICD-10-CM | POA: Diagnosis not present

## 2024-06-08 DIAGNOSIS — L84 Corns and callosities: Secondary | ICD-10-CM | POA: Diagnosis not present

## 2024-06-08 DIAGNOSIS — M79674 Pain in right toe(s): Secondary | ICD-10-CM | POA: Diagnosis not present

## 2024-06-08 DIAGNOSIS — G629 Polyneuropathy, unspecified: Secondary | ICD-10-CM | POA: Diagnosis not present

## 2024-06-08 DIAGNOSIS — E1151 Type 2 diabetes mellitus with diabetic peripheral angiopathy without gangrene: Secondary | ICD-10-CM

## 2024-06-09 ENCOUNTER — Encounter: Payer: Self-pay | Admitting: Cardiology

## 2024-06-15 ENCOUNTER — Other Ambulatory Visit: Payer: Self-pay | Admitting: Cardiology

## 2024-06-15 NOTE — Progress Notes (Signed)
 ANNUAL DIABETIC FOOT EXAM  Subjective: Veronica Harrison presents today for annual diabetic foot exam.  Patient confirms h/o diabetes.  Patient denies any h/o foot wounds.  Patient has been diagnosed with neuropathy.  Veronica Pulling, MD is patient's PCP.  Past Medical History:  Diagnosis Date   Atrial fibrillation (HCC)    Chronic pain    Graves' disease    Hypertension    Psoriatic arthritis (HCC)    Type 2 diabetes mellitus (HCC)    Vitamin D deficiency    Patient Active Problem List   Diagnosis Date Noted   Encounter for colorectal cancer screening using Cologuard test 10/22/2023   Pain in joint, pelvic region and thigh 07/26/2023   Irritable bowel syndrome 07/26/2023   Neuropathy 07/26/2023   Night sweats 07/26/2023   Palpitations 07/26/2023   Posttraumatic stress disorder 07/26/2023   Shoulder pain 07/26/2023   Body mass index (BMI) 34.0-34.9, adult 07/30/2022   Primary osteoarthritis 07/30/2022   Psoriasis 07/30/2022   Vitamin D deficiency 07/30/2022   Hyperglycemia due to type 2 diabetes mellitus (HCC) 03/05/2022   History of corticosteroid therapy 03/05/2022   Iatrogenic Cushing's syndrome (HCC) 03/05/2022   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   Hypotropia of left eye 06/12/2021   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   Urethral bleeding 02/27/2021   At risk for falls 07/20/2020   Depression 07/20/2020   Former smoker 07/20/2020   GERD (gastroesophageal reflux disease) 07/20/2020   High-tone pelvic floor dysfunction 07/18/2020   Osteoarthritis of left knee 05/30/2020   Age-related osteoporosis with current pathological fracture 05/15/2020   Kyphosis due to osteoporosis 05/15/2020   Closed compression fracture of body of L1 vertebra (HCC) 05/15/2020   Osteopenia 03/27/2020   Degeneration of lumbar intervertebral disc 01/25/2020   History of revision of total replacement of left hip joint 01/19/2020   Pain of lumbar spine 01/11/2020    Enterocele 08/26/2019   Voiding dysfunction 08/26/2019   Chronic, continuous use of opioids 08/06/2018   Graves' disease 08/06/2018   Primary osteoarthritis of left knee 07/23/2018   Chronic anemia 05/17/2018   Elevated platelet count 05/17/2018   Psoriatic arthritis (HCC) 05/17/2018   Status post total hip replacement, 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   Chronic anticoagulation 01/31/2018   Left knee pain 09/30/2017   Right hip pain 09/30/2017   Injury of iliac artery 08/24/2017   Intramural aortic hematoma (HCC) 07/22/2017   SAH (subarachnoid hemorrhage) (HCC) 07/22/2017   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 07/21/2017   Fracture of head of left femur (HCC) 07/21/2017   Posterior dislocation of hip, closed, left, initial encounter (HCC) 07/21/2017   Laceration of knee 06/10/2017   Vaginal prolapse 04/14/2017   Urinary retention 07/21/2016   Hypothyroidism due to medicaments and other exogenous substances 01/28/2016   A-fib (HCC) 03/10/2015   HTN (hypertension) 03/10/2015   DM2 (diabetes mellitus, type 2) (HCC) 03/10/2015   Urinary bladder incontinence 10/11/2014   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     apixaban  (ELIQUIS ) 5 MG TABS tablet TAKE (1) TABLET TWICE DAILY. 60 tablet 5   bisacodyl (DULCOLAX) 5 MG EC tablet Take 5 mg by mouth at bedtime.     Blood Pressure KIT 1 Device by Does not apply route daily. 1 each 0   Chlorphen-Pseudoephed-APAP  (CORICIDIN D PO) Take 2 tablets by mouth once as needed (allegies).     clindamycin (CLEOCIN) 150 MG capsule 4 capsules Orally prior to dental visits     clobetasol ointment (TEMOVATE) 0.05 % Apply 1 Application topically as needed (for rash).     clonazePAM (KLONOPIN) 1 MG tablet Take 1 mg by mouth at bedtime.     cyanocobalamin (,VITAMIN B-12,) 1000 MCG/ML injection Inject 1,000 mcg into the muscle every 30 (thirty) days.     dexamethasone (DECADRON) 4 MG tablet Take by mouth.     diclofenac Sodium (VOLTAREN) 1 % GEL SMARTSIG:3 Topical 10 Times Daily PRN     diltiazem  (CARDIZEM ) 30 MG tablet take 1 tablet twice daily -- may take additional tablet as needed for palpitations or sbp >150 90 tablet 2   estradiol (ESTRACE) 0.1 MG/GM vaginal cream Insert pea-sized amount of cream per vagina every other night     Fluticasone-Salmeterol (ADVAIR) 250-50 MCG/DOSE AEPB Inhale 1 puff into the lungs as needed (wheezing and SOB).     folic acid (FOLVITE) 1 MG tablet Take 1 mg by mouth daily.     furosemide  (LASIX ) 20 MG tablet TAKE 1 TABLET BY MOUTH DAILY AS NEEDED FOR EDEMA OR FLUID. 30 tablet 3   glucose blood test strip 1 each by Other route as needed for other. Use as instructed     hydrALAZINE  (APRESOLINE ) 100 MG tablet take 1 tablet (100 milligram total) by mouth 2 (two) times daily. 60 tablet 5   ipratropium (ATROVENT HFA) 17 MCG/ACT inhaler Inhale 2 puffs into the lungs every 6 (six) hours.     ketoconazole (NIZORAL) 2 % shampoo Apply topically.     Lancets 28G MISC 28 g by Does not apply route daily.     levothyroxine  (SYNTHROID ) 150 MCG tablet 1 tablet on an empty stomach in the morning Orally Once a day     metFORMIN (GLUCOPHAGE) 500 MG tablet Take 1,000 mg by mouth 2 (two) times daily with a meal.     methotrexate (RHEUMATREX) 2.5 MG tablet Take 2.5 mg by mouth once a week.     Misc. Devices (PULSE OXIMETER FOR FINGER) MISC 1 Device by Does not apply route daily. 1 each 0   mupirocin ointment  (BACTROBAN) 2 % Apply 1 application topically daily as needed.      nystatin cream (MYCOSTATIN) Apply 1 application topically 3 (three) times daily.     ondansetron (ZOFRAN) 4 MG tablet Take by mouth.     oxyCODONE (OXYCONTIN) 30 MG 12 hr tablet Take 30 mg by mouth 5 (five) times daily.     potassium chloride  SA (KLOR-CON  M) 20 MEQ tablet TAKE 1 TABLET DAILY AS NEEDED (ONLY ON DAYS YOU TAKE YOUR LASIX ) 30 tablet 3   Risankizumab -rzaa (SKYRIZI  PEN) 150 MG/ML SOAJ 150mg  Subcutaneous every 12 weeks     sotalol  (BETAPACE ) 120 MG tablet take (1) tablet twice daily. 60 tablet 11   spironolactone  (ALDACTONE ) 25 MG tablet take 1 tablet (25 MILLIGRAM total) by mouth daily. 90 tablet 1   triamcinolone (NASACORT) 55 MCG/ACT AERO nasal inhaler Place  1 spray into the nose daily.     Vitamin D, Ergocalciferol, (DRISDOL) 1.25 MG (50000 UNIT) CAPS capsule Take 50,000 Units by mouth every 7 (seven) days.     No current facility-administered medications on file prior to visit.    Allergies  Allergen Reactions   Gabapentin Other (See Comments)    Urinary incontinence   Other reaction(s): urinary retention  Other Reaction(s): Not available  Urinary incontinence , Urinary retention- pt had catheter for 4 months, blurred vision ,headache   Other Shortness Of Breath   Penicillins Anaphylaxis   Strawberry Extract Anaphylaxis    Other reaction(s): wheezing   Ace Inhibitors Other (See Comments)    Feeling Faint  Other reaction(s): Pass out Other reaction(s): Unknown   Pseudoephedrine Other (See Comments)    Makes heart race  Other Reaction(s): Not available   Social History   Occupational History   Not on file  Tobacco Use   Smoking status: Former    Current packs/day: 0.00    Average packs/day: 1 pack/day for 34.7 years (34.7 ttl pk-yrs)    Types: Cigarettes    Start date: 01/29/1970    Quit date: 09/30/2004    Years since quitting: 19.7   Smokeless tobacco: Never   Tobacco comments:    10  years ago  Vaping Use   Vaping status: Never Used  Substance and Sexual Activity   Alcohol use: No    Alcohol/week: 0.0 standard drinks of alcohol   Drug use: No   Sexual activity: Not on file   Family History  Problem Relation Age of Onset   Diabetes Other    CAD Other    Immunization History  Administered Date(s) Administered   Influenza, Seasonal, Injecte, Preservative Fre 10/31/2010, 09/25/2011, 09/22/2012, 09/12/2013, 10/02/2014, 12/03/2015   Influenza,inj,Quad PF,6+ Mos 10/10/2016, 10/06/2019   Influenza,inj,Quad PF,6-35 Mos 10/13/2018   Influenza,inj,quad, With Preservative 11/29/2020   Influenza-Unspecified 10/11/2017   PPD Test 11/18/2013, 05/30/2014     Review of Systems: Negative except as noted in the HPI.   Objective: There were no vitals filed for this visit.  Veronica Harrison is a pleasant 73 y.o. female in NAD. AAO X 3.  Diabetic foot exam was performed with the following findings:   Vascular Examination: CFT <3 seconds b/l. DP pulses 1/4 b/l. PT pulses 0/4 b/l. Digital hair absent. Skin temperature gradient warm to warm b/l. No pain with calf compression. No ischemia or gangrene. No cyanosis or clubbing noted b/l. Pedal hair sparse. Trace edema noted BLE. Varicosities present b/l.   Neurological Examination: Pt has subjective symptoms of neuropathy. Protective sensation decreased with 10g monofilament b/l.  Dermatological Examination: Pedal skin warm and supple b/l. No open wounds b/l. No interdigital macerations. Toenails 1-5 b/l thick, discolored, elongated with subungual debris and pain on dorsal palpation.  Well healed surgical scar(s) noted b/l lower extremities.  Hyperkeratotic lesion(s) submet head 1 left foot, submet head 3 left foot, and submet head 5 left foot.  No erythema, no edema, no drainage, no fluctuance.  Musculoskeletal Examination: Muscle strength 4/5 to all lower extremity muscle groups bilaterally. Hammertoe deformity noted 2-5  b/l. Plantar fat pad atrophy of forefoot area b/l lower extremities.  Radiographs: None     ADA Risk Categorization: High Risk  Patient has one or more of the following: Loss of protective sensation Absent pedal pulses Severe Foot deformity History of foot ulcer  Assessment: 1. Pain due to onychomycosis of toenails of both feet   2. Callus  3. Neuropathy   4. Type II diabetes mellitus with peripheral circulatory disorder (HCC)     Plan: Diabetic foot examination performed today. All patient's and/or POA's questions/concerns addressed on today's visit. Toenails 1-5 debrided in length and girth without incident. Callus(es) submet head 1 left foot, submet head 3 left foot, and submet head 5 left foot pared with sharp debridement without incident. Continue daily foot inspections and monitor blood glucose per PCP/Endocrinologist's recommendations.Continue soft, supportive shoe gear daily. Report any pedal injuries to medical professional. Call office if there are any questions/concerns. Return in about 3 months (around 09/08/2024).  Veronica Harrison, DPM      [facility] LOCATION: [address], [place] [address]                   Office [phone]   [facility] LOCATION: [address] [place], [place] [address] Office [phone]

## 2024-07-25 ENCOUNTER — Other Ambulatory Visit: Payer: Self-pay | Admitting: Cardiology

## 2024-08-04 ENCOUNTER — Other Ambulatory Visit: Payer: Self-pay | Admitting: *Deleted

## 2024-08-04 DIAGNOSIS — I4891 Unspecified atrial fibrillation: Secondary | ICD-10-CM

## 2024-08-04 MED ORDER — APIXABAN 5 MG PO TABS: 5.0000 mg | ORAL_TABLET | Freq: Two times a day (BID) | ORAL | 1 refills | Status: DC

## 2024-08-04 NOTE — Telephone Encounter (Signed)
 Prescription refill request for Eliquis  received. Indication: afib  Last office visit: 11/16/2023, Branch Scr: 0.82, 05/10/2024 Age: 73 yo  Weight: 99.2 kg   Refill sent.

## 2024-08-18 ENCOUNTER — Ambulatory Visit: Attending: Cardiology | Admitting: Cardiology

## 2024-08-18 ENCOUNTER — Encounter: Payer: Self-pay | Admitting: Cardiology

## 2024-08-18 VITALS — BP 132/58 | HR 70 | Ht 64.0 in | Wt 210.8 lb

## 2024-08-18 DIAGNOSIS — D6869 Other thrombophilia: Secondary | ICD-10-CM | POA: Diagnosis present

## 2024-08-18 DIAGNOSIS — R600 Localized edema: Secondary | ICD-10-CM | POA: Diagnosis present

## 2024-08-18 DIAGNOSIS — I4891 Unspecified atrial fibrillation: Secondary | ICD-10-CM | POA: Diagnosis present

## 2024-08-18 DIAGNOSIS — I10 Essential (primary) hypertension: Secondary | ICD-10-CM | POA: Insufficient documentation

## 2024-08-18 MED ORDER — HYDRALAZINE HCL 100 MG PO TABS: 100.0000 mg | ORAL_TABLET | Freq: Two times a day (BID) | ORAL | 6 refills | Status: AC

## 2024-08-18 MED ORDER — APIXABAN 5 MG PO TABS: 5.0000 mg | ORAL_TABLET | Freq: Two times a day (BID) | ORAL | 3 refills | Status: DC

## 2024-08-18 MED ORDER — SPIRONOLACTONE 25 MG PO TABS: 12.5000 mg | ORAL_TABLET | Freq: Every day | ORAL | 6 refills | Status: DC

## 2024-08-18 MED ORDER — DILTIAZEM HCL 30 MG PO TABS: 30.0000 mg | ORAL_TABLET | Freq: Two times a day (BID) | ORAL | 6 refills | Status: DC

## 2024-08-18 MED ORDER — SOTALOL HCL 120 MG PO TABS: 120.0000 mg | ORAL_TABLET | Freq: Two times a day (BID) | ORAL | 6 refills | Status: AC

## 2024-08-18 MED ORDER — FUROSEMIDE 20 MG PO TABS: 20.0000 mg | ORAL_TABLET | ORAL | 6 refills | Status: AC | PRN

## 2024-08-18 MED ORDER — POTASSIUM CHLORIDE CRYS ER 20 MEQ PO TBCR: 20.0000 meq | EXTENDED_RELEASE_TABLET | Freq: Every day | ORAL | 6 refills | Status: AC | PRN

## 2024-08-18 NOTE — Patient Instructions (Signed)
 Medication Instructions:   90 day refills sent for the Eliquis  30 day refills sent for Spironolactone , Diltiazem , Furosemide , Hydralazine , Potassium, Sotalol  Continue all other medications.     Labwork:  none  Testing/Procedures:  none  Follow-Up:  6 months   Any Other Special Instructions Will Be Listed Below (If Applicable).   If you need a refill on your cardiac medications before your next appointment, please call your pharmacy.
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
## 2024-09-07 ENCOUNTER — Telehealth: Payer: Self-pay | Admitting: Cardiology

## 2024-09-07 MED ORDER — DILTIAZEM HCL 30 MG PO TABS: 30.0000 mg | ORAL_TABLET | Freq: Two times a day (BID) | ORAL | 3 refills | Status: AC

## 2024-09-07 NOTE — Telephone Encounter (Signed)
*  STAT* If patient is at the pharmacy, call can be transferred to refill team.   1. Which medications need to be refilled? (please list name of each medication and dose if known) diltiazem  (CARDIZEM ) 30 MG tablet   4. Which pharmacy/location (including street and city if local pharmacy) is medication to be sent to?  [facility] - [place], [place] - [address]     5. Do they need a 30 day or 90 day supply? 90   She asked that it be sent again but the pharmacy only gave her 60 tablets

## 2024-09-07 NOTE — Telephone Encounter (Signed)
 Pt's medication was sent to pt's pharmacy as requested. Confirmation received.

## 2024-09-28 ENCOUNTER — Encounter: Payer: Self-pay | Admitting: Podiatry

## 2024-09-28 ENCOUNTER — Ambulatory Visit: Admitting: Podiatry

## 2024-09-28 DIAGNOSIS — M79674 Pain in right toe(s): Secondary | ICD-10-CM | POA: Diagnosis not present

## 2024-09-28 DIAGNOSIS — B351 Tinea unguium: Secondary | ICD-10-CM | POA: Diagnosis not present

## 2024-09-28 DIAGNOSIS — M25571 Pain in right ankle and joints of right foot: Secondary | ICD-10-CM | POA: Diagnosis not present

## 2024-09-28 DIAGNOSIS — L84 Corns and callosities: Secondary | ICD-10-CM

## 2024-09-28 DIAGNOSIS — E1151 Type 2 diabetes mellitus with diabetic peripheral angiopathy without gangrene: Secondary | ICD-10-CM

## 2024-09-28 DIAGNOSIS — M79675 Pain in left toe(s): Secondary | ICD-10-CM

## 2024-09-28 DIAGNOSIS — G629 Polyneuropathy, unspecified: Secondary | ICD-10-CM

## 2024-10-02 NOTE — Progress Notes (Signed)
 Subjective:  Patient ID: Veronica Harrison, female    DOB: 08-30-1951,  MRN: [id number]  Veronica Harrison presents to clinic today for at risk foot care with history of diabetic neuropathy and callus(es) left foot and painful mycotic toenails that are difficult to trim. Painful toenails interfere with ambulation. Aggravating factors include wearing enclosed shoe gear. Pain is relieved with periodic professional debridement. Painful calluses are aggravated when weightbearing with and without shoegear. Pain is relieved with periodic professional debridement. Patient relates  she will be seeing [facility] for f/u for pain RLE. 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 She also has issues with left knee OA. Chief Complaint  Patient presents with   Diabetes    DFC NIDDM A1C 8.0. Toenail trim.     PCP is Toribio Jerel MATSU, MD.  Allergies  Allergen Reactions   Gabapentin Other (See Comments)    Urinary incontinence   Other reaction(s): urinary retention  Other Reaction(s): Not available  Urinary incontinence , Urinary retention- pt had catheter for 4 months, blurred vision ,headache   Other Shortness Of Breath   Penicillins Anaphylaxis   Strawberry Extract Anaphylaxis    Other reaction(s): wheezing   Ace Inhibitors Other (See Comments)    Feeling Faint  Other reaction(s): Pass out Other reaction(s): Unknown   Pseudoephedrine Other (See Comments)    Makes heart race  Other Reaction(s): Not available    Review of Systems: Negative except as noted in the HPI.  Objective: No changes noted in today's physical examination. There were no vitals filed for this visit. Veronica Harrison is a pleasant 73 y.o. female in NAD. AAO x 3.  Vascular Examination: CFT <3 seconds b/l. DP pulses faintly palpable b/l. PT pulses nonpalpable b/l. Digital hair sparse. Skin temperature gradient warm to warm b/l. No pain with calf compression.  No ischemia or gangrene. No cyanosis or clubbing noted b/l. Trace edema noted BLE. Varicosities present b/l.   Neurological Examination: Pt has subjective symptoms of neuropathy. Protective sensation decreased with 10 gram monofilament b/l.  Dermatological Examination: No open wounds b/l. No interdigital macerations. Toenails 1-5 b/l thick, discolored, elongated with subungual debris and pain on dorsal palpation.  Well healed surgical scar(s) noted left lower extremity and right lower extremity. Hyperkeratotic lesion(s) submet head 1 left foot and submet head 5 left foot.No erythema, no edema, no drainage, no fluctuance.  Musculoskeletal Examination: Muscle strength 4/5 to all lower extremity muscle groups LLE.  Hammertoe deformity noted 2-5 b/l. Plantar fat pad atrophy of both feet.   Radiographs: None  Assessment/Plan: 1. Pain due to onychomycosis of toenails of both feet   2. Callus   3. Neuropathy   4. Type II diabetes mellitus with peripheral circulatory disorder [facility])   Consent given for treatment. Patient examined. All patient's and/or POA's questions/concerns addressed on today's visit. Mycotic toenails 1-5 debrided in length and girth without incident. Callus(es) submet head 1 left foot and submet head 5 left foot pared with sharp debridement without incident. Continue foot and shoe inspections daily. Monitor blood glucose per PCP/Endocrinologist's recommendations.Continue soft, supportive shoe gear daily.  -Dispensed Darco shoe for right foot.Patient/POA signed ABN for DME. -Patient/POA to call should there be question/concern in the interim.   Return in about 3 months (around 12/29/2024).  Delon LITTIE Merlin, DPM      [place] LOCATION: [address]  [place], [place] [address]                   Office [phone]   [facility] LOCATION: [address] [place], [place] [address] Office [phone]

## 2024-10-12 ENCOUNTER — Encounter (INDEPENDENT_AMBULATORY_CARE_PROVIDER_SITE_OTHER): Payer: Self-pay | Admitting: Gastroenterology

## 2024-11-21 ENCOUNTER — Other Ambulatory Visit: Payer: Self-pay | Admitting: Cardiology

## 2024-11-21 ENCOUNTER — Telehealth: Payer: Self-pay | Admitting: Cardiology

## 2024-11-21 MED ORDER — SPIRONOLACTONE 25 MG PO TABS: 12.5000 mg | ORAL_TABLET | Freq: Every day | ORAL | 2 refills | Status: DC

## 2024-11-21 NOTE — Telephone Encounter (Signed)
 Pt's medication was sent to pt's pharmacy as requested. Confirmation received.

## 2024-11-21 NOTE — Telephone Encounter (Signed)
*  STAT* If patient is at the pharmacy, call can be transferred to refill team.   1. Which medications need to be refilled? (please list name of each medication and dose if known) spironolactone  (ALDACTONE ) 25 MG tablet    2. Would you like to learn more about the convenience, safety, & potential cost savings by using the [facility]? NO    3. Are you open to using the [facility] NO   4. Which pharmacy/location (including street and city if local pharmacy) is medication to be sent to?   [facility] - [place], [place] - [address]     5. Do they need a 30 day or 90 day supply? 90

## 2024-12-01 ENCOUNTER — Telehealth: Payer: Self-pay | Admitting: Cardiology

## 2024-12-01 MED ORDER — SPIRONOLACTONE 25 MG PO TABS: 12.5000 mg | ORAL_TABLET | Freq: Two times a day (BID) | ORAL | 0 refills | Status: AC

## 2024-12-01 NOTE — Telephone Encounter (Signed)
 Can continue medications as she has been taking. Spironolactone  25mg  daily and hydralazine  100mg  bid, please update med list  JINNY Ross MD

## 2024-12-01 NOTE — Telephone Encounter (Signed)
*  STAT* If patient is at the pharmacy, call can be transferred to refill team.   1. Which medications need to be refilled? (please list name of each medication and dose if known)   spironolactone  (ALDACTONE ) 25 MG tablet  apixaban  (ELIQUIS ) 5 MG TABS tablet   2. Would you like to learn more about the convenience, safety, & potential cost savings by using the [facility]? no   3. Are you open to using the [facility] ([facility]. no   4. Which pharmacy/location (including street and city if local pharmacy) is medication to be sent to?  [facility] - [place], [place] - [address]    5. Do they need a 30 day or 90 day supply? 30 days

## 2024-12-01 NOTE — Telephone Encounter (Signed)
 Per Dr. Alvan, Spironolactone  25 mg 1/2 tablet BID has been sent to pt's pharmacy.  Pt was good on the Hydralazine .  Went ahead and scheduled pt for her follow-up appointment.

## 2024-12-01 NOTE — Telephone Encounter (Signed)
 Returned call to pt.  Pt was asking for refill for Spironolactone  25 mg 1/2 tablet daily.  Per pt, she was never told to reduce it to 1/2 tablet daily.  I did try to track it down, and I don't see any documentation stating to reduce it.  Looks like at office visit, 08/18/24, Jon Potters sent in refill for 1/2 tablet daily.  Also, Hydralazine , pt says she's only been taking twice a day and per Dr. Ranae office note, it says recently increased to TID.   Pt is aware I am sending phone note to Dr. Alvan / Jon Potters, LPN to look through, and may have to call the pt, to sort out.  Pt verbalized that Yeah, I am confused.  And thanked me for the call back.

## 2025-01-10 ENCOUNTER — Ambulatory Visit: Admitting: Podiatry

## 2025-01-10 ENCOUNTER — Encounter: Payer: Self-pay | Admitting: Podiatry

## 2025-01-10 DIAGNOSIS — E1142 Type 2 diabetes mellitus with diabetic polyneuropathy: Secondary | ICD-10-CM

## 2025-01-10 DIAGNOSIS — B351 Tinea unguium: Secondary | ICD-10-CM

## 2025-01-10 DIAGNOSIS — L84 Corns and callosities: Secondary | ICD-10-CM

## 2025-01-16 ENCOUNTER — Encounter: Payer: Self-pay | Admitting: Podiatry

## 2025-01-16 NOTE — Progress Notes (Signed)
 "  Subjective:  Patient ID: Veronica Harrison, female    DOB: 23-Oct-1951,  MRN: [id number]  Veronica Harrison presents to clinic today for at risk foot care with history of diabetic neuropathy and callus(es) left lower extremity and painful thick toenails that are difficult to trim. Painful toenails interfere with ambulation. Aggravating factors include wearing enclosed shoe gear. Pain is relieved with periodic professional debridement. Painful calluses are aggravated when weightbearing with and without shoegear. Pain is relieved with periodic professional debridement.  Chief Complaint  Patient presents with   Diabetes    Trim my nail and get the calluses off.  She's going to look at the boot and let me know of a shoe that works for me.  Saw Dr. Faythe - 10/2024; A1c - 8.8   New problem(s): None.   PCP is Toribio Jerel MATSU, MD.  Allergies[1]  Review of Systems: Negative except as noted in the HPI.  Objective: No changes noted in today's physical examination. There were no vitals filed for this visit. Veronica Harrison is a pleasant 74 y.o. female in NAD. AAO x 3.  Vascular Examination: CFT <3 seconds b/l. DP pulses faintly palpable b/l. PT pulses nonpalpable b/l. Digital hair sparse. Skin temperature gradient warm to warm b/l. No pain with calf compression. No ischemia or gangrene. No cyanosis or clubbing noted b/l. Trace edema noted BLE. Varicosities present b/l.   Neurological Examination: Pt has subjective symptoms of neuropathy. Protective sensation decreased with 10 gram monofilament b/l.  Dermatological Examination: No open wounds b/l. No interdigital macerations. Toenails 1-5 b/l thick, discolored, elongated with subungual debris and pain on dorsal palpation.  Well healed surgical scar(s) noted left lower extremity and right lower extremity. Hyperkeratotic lesion(s) submet head 1 left foot and submet head 5 left foot. No erythema, no edema, no drainage, no  fluctuance.  Musculoskeletal Examination: Muscle strength 4/5 to all lower extremity muscle groups LLE.  Hammertoe deformity noted 2-5 b/l. Plantar fat pad atrophy of both feet.   Radiographs: None  Assessment/Plan: 1. Pain due to onychomycosis of toenails of both feet   2. Callus   3. Diabetic peripheral neuropathy associated with type 2 diabetes mellitus (HCC)   Patient was evaluated and treated. All patient's and/or POA's questions/concerns addressed on today's visit. Toenails 1-5 b/l debrided in length and girth without incident. Callus(es) submet head 1 left foot and submet head 5 left foot pared with sharp debridement without incident. Continue daily foot inspections and monitor blood glucose per PCP/Endocrinologist's recommendations. Continue soft, supportive shoe gear daily. Will order diabetic shoes on next visit. Report any pedal injuries to medical professional. Call office if there are any questions/concerns. Return in about 3 months (around 04/10/2025).  Delon LITTIE Merlin, DPM      [place] LOCATION: [address], [place] [address]                   Office [phone]   [place] LOCATION: [address] [place], [place] [address] Office [phone]     [1]  Allergies Allergen Reactions   Gabapentin Other (See Comments)    Urinary incontinence   Other reaction(s): urinary retention  Other Reaction(s): Not available  Urinary  incontinence , Urinary retention- pt had catheter for 4 months, blurred vision ,headache   Other Shortness Of Breath   Penicillins Anaphylaxis   Strawberry Extract Anaphylaxis    Other reaction(s): wheezing   Ace Inhibitors Other (See Comments)    Feeling Faint  Other reaction(s): Pass out Other reaction(s): Unknown   Pseudoephedrine Other (See Comments)    Makes heart race  Other Reaction(s): Not available   "

## 2025-02-02 ENCOUNTER — Other Ambulatory Visit (HOSPITAL_COMMUNITY): Payer: Self-pay

## 2025-02-02 ENCOUNTER — Telehealth: Payer: Self-pay | Admitting: Cardiology

## 2025-02-02 DIAGNOSIS — I4891 Unspecified atrial fibrillation: Secondary | ICD-10-CM

## 2025-02-02 MED ORDER — APIXABAN 5 MG PO TABS
5.0000 mg | ORAL_TABLET | Freq: Two times a day (BID) | ORAL | 1 refills | Status: AC
  Filled 2025-02-02: qty 60, 30d supply, fill #0

## 2025-02-02 NOTE — Telephone Encounter (Signed)
" °*  STAT* If patient is at the pharmacy, call can be transferred to refill team.   1. Which medications need to be refilled? (please list name of each medication and dose if known)   apixaban  (ELIQUIS ) 5 MG TABS tablet     2. Would you like to learn more about the convenience, safety, & potential cost savings by using the [facility]? NO     3. Are you open to using the [facility] ([facility]. No    4. Which pharmacy/location (including street and city if local pharmacy) is medication to be sent to? [facility] - [place], [place] - [address]     5. Do they need a 30 day or 90 day supply? 30 day   "

## 2025-02-02 NOTE — Telephone Encounter (Signed)
 Refill sent.  Prescription refill request for Eliquis  received. Indication: AFIB Last office visit: 07/2024 Scr:  0.82 (04/2024) Age: 74 Weight: 95.6KG

## 2025-02-24 ENCOUNTER — Ambulatory Visit: Admitting: Cardiology

## 2025-04-18 ENCOUNTER — Ambulatory Visit: Admitting: Podiatry
